# Patient Record
Sex: Male | Born: 1935 | Race: White | Hispanic: No | Smoking: Former smoker
Health system: Southern US, Community
[De-identification: ages and names within clinical notes are randomized; demographics above are authoritative.]

## PROBLEM LIST (undated history)

## (undated) DIAGNOSIS — M549 Dorsalgia, unspecified: Secondary | ICD-10-CM

## (undated) DIAGNOSIS — E119 Type 2 diabetes mellitus without complications: Secondary | ICD-10-CM

## (undated) HISTORY — PX: TONSILECTOMY, ADENOIDECTOMY, BILATERAL MYRINGOTOMY AND TUBES: SHX2538

## (undated) HISTORY — DX: Type 2 diabetes mellitus without complications: E11.9

## (undated) HISTORY — DX: Dorsalgia, unspecified: M54.9

---

## 1995-03-08 HISTORY — PX: PROSTATE SURGERY: SHX751

## 2005-03-07 HISTORY — PX: BLADDER SURGERY: SHX569

## 2016-06-27 DIAGNOSIS — C678 Malignant neoplasm of overlapping sites of bladder: Secondary | ICD-10-CM | POA: Diagnosis not present

## 2016-07-06 DIAGNOSIS — E1121 Type 2 diabetes mellitus with diabetic nephropathy: Secondary | ICD-10-CM | POA: Diagnosis not present

## 2016-07-25 DIAGNOSIS — E1121 Type 2 diabetes mellitus with diabetic nephropathy: Secondary | ICD-10-CM | POA: Diagnosis not present

## 2016-07-25 DIAGNOSIS — E782 Mixed hyperlipidemia: Secondary | ICD-10-CM | POA: Diagnosis not present

## 2016-07-25 DIAGNOSIS — M545 Low back pain: Secondary | ICD-10-CM | POA: Diagnosis not present

## 2016-07-25 DIAGNOSIS — I10 Essential (primary) hypertension: Secondary | ICD-10-CM | POA: Diagnosis not present

## 2016-11-21 DIAGNOSIS — Z23 Encounter for immunization: Secondary | ICD-10-CM | POA: Diagnosis not present

## 2017-01-09 DIAGNOSIS — E1121 Type 2 diabetes mellitus with diabetic nephropathy: Secondary | ICD-10-CM | POA: Diagnosis not present

## 2017-01-09 DIAGNOSIS — E782 Mixed hyperlipidemia: Secondary | ICD-10-CM | POA: Diagnosis not present

## 2017-02-01 DIAGNOSIS — E1121 Type 2 diabetes mellitus with diabetic nephropathy: Secondary | ICD-10-CM | POA: Diagnosis not present

## 2017-02-01 DIAGNOSIS — E782 Mixed hyperlipidemia: Secondary | ICD-10-CM | POA: Diagnosis not present

## 2017-02-01 DIAGNOSIS — Z0000 Encounter for general adult medical examination without abnormal findings: Secondary | ICD-10-CM | POA: Diagnosis not present

## 2017-02-01 DIAGNOSIS — Z6829 Body mass index (BMI) 29.0-29.9, adult: Secondary | ICD-10-CM | POA: Diagnosis not present

## 2017-02-01 DIAGNOSIS — I10 Essential (primary) hypertension: Secondary | ICD-10-CM | POA: Diagnosis not present

## 2017-02-01 DIAGNOSIS — M545 Low back pain: Secondary | ICD-10-CM | POA: Diagnosis not present

## 2017-08-14 ENCOUNTER — Ambulatory Visit (INDEPENDENT_AMBULATORY_CARE_PROVIDER_SITE_OTHER): Payer: Medicare Other | Admitting: Internal Medicine

## 2017-08-14 ENCOUNTER — Encounter: Payer: Self-pay | Admitting: Internal Medicine

## 2017-08-14 VITALS — BP 118/68 | HR 85 | Temp 98.6°F | Resp 16 | Ht 63.0 in | Wt 173.0 lb

## 2017-08-14 DIAGNOSIS — M545 Low back pain, unspecified: Secondary | ICD-10-CM

## 2017-08-14 DIAGNOSIS — Z8551 Personal history of malignant neoplasm of bladder: Secondary | ICD-10-CM | POA: Insufficient documentation

## 2017-08-14 DIAGNOSIS — E119 Type 2 diabetes mellitus without complications: Secondary | ICD-10-CM | POA: Diagnosis not present

## 2017-08-14 DIAGNOSIS — E118 Type 2 diabetes mellitus with unspecified complications: Secondary | ICD-10-CM | POA: Insufficient documentation

## 2017-08-14 DIAGNOSIS — F17201 Nicotine dependence, unspecified, in remission: Secondary | ICD-10-CM | POA: Diagnosis not present

## 2017-08-14 DIAGNOSIS — Z8546 Personal history of malignant neoplasm of prostate: Secondary | ICD-10-CM | POA: Diagnosis not present

## 2017-08-14 DIAGNOSIS — I10 Essential (primary) hypertension: Secondary | ICD-10-CM | POA: Diagnosis not present

## 2017-08-14 NOTE — Progress Notes (Signed)
Date:  08/14/2017   Name:  Jesus Chavez   DOB:  1931-04-03   MRN:  [id number]  New pt recently moved here from [place] to be closer to daughter.  Residing with wife in [place].  Chief Complaint: Establish Care Diabetes  He presents for his follow-up diabetic visit. He has type 2 diabetes mellitus. His disease course has been stable. Pertinent negatives for hypoglycemia include no dizziness or headaches. Pertinent negatives for diabetes include no chest pain. Pertinent negatives for diabetic complications include no PVD. Current diabetic treatment includes oral agent (monotherapy). He is compliant with treatment all of the time. His weight is stable. He rarely participates in exercise. There is no compliance with monitoring of blood glucose. An ACE inhibitor/angiotensin II receptor blocker is being taken.  Hypertension  This is a chronic problem. The problem is unchanged. Pertinent negatives include no chest pain, headaches, palpitations or shortness of breath. Risk factors for coronary artery disease include diabetes mellitus. Past treatments include ACE inhibitors. There is no history of kidney disease, CAD/MI or PVD.  Back Pain  This is a chronic problem. The pain is present in the lumbar spine. The symptoms are aggravated by standing and twisting. Pertinent negatives include no abdominal pain, chest pain, fever or headaches. He has tried analgesics (tramadol once a day - usually in the morning) for the symptoms. The treatment provided significant relief.  He had had no intervention for his back pain. 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    Review of Systems  Constitutional: Negative for diaphoresis, fever and unexpected weight change.  Respiratory: Negative for chest tightness, shortness of breath and wheezing.   Cardiovascular: Negative for chest pain and palpitations.  Gastrointestinal: Negative for abdominal pain, constipation and  diarrhea.  Genitourinary: Negative for difficulty urinating.       Nocturia x 2  Musculoskeletal: Positive for back pain.  Neurological: Negative for dizziness and headaches.  Psychiatric/Behavioral: Negative for sleep disturbance.    Patient Active Problem List   Diagnosis Date Noted  . Lumbar back pain 08/14/2017  . Essential hypertension 08/14/2017  . Type 2 diabetes mellitus without complication, without long-term current use of insulin ([place]) 08/14/2017  . History of bladder cancer 08/14/2017  . Tobacco use disorder, moderate, in sustained remission, dependence 08/14/2017    Prior to Admission medications   Medication Sig Start Date End Date Taking? Authorizing Provider  aspirin EC 81 MG tablet Take 81 mg by mouth daily.   Yes [provider]  glyBURIDE (DIABETA) 5 MG tablet Take 5 mg by mouth 3 (three) times daily. 07/12/17  Yes [provider]  Multiple Vitamins-Minerals ([facility]) TABS Take 1 tablet by mouth daily.   Yes [provider]  quinapril (ACCUPRIL) 40 MG tablet Take 40 mg by mouth 2 (two) times daily. 07/13/17  Yes [provider]  traMADol (ULTRAM) 50 MG tablet Take 50 mg by mouth every 6 (six) hours as needed. 07/12/17  Yes [provider]    No Known Allergies  Past Surgical History:  Procedure Laterality Date  . BLADDER SURGERY  2007   CANCER REMOVAL   . PROSTATE SURGERY  1997   CANCER REMOVAL   . TONSILECTOMY, ADENOIDECTOMY, BILATERAL MYRINGOTOMY AND TUBES      Social History   Tobacco Use  . Smoking status: Former Smoker    Packs/day: 1.00    Years: 60.00    Pack years:  60.00    Types: Cigarettes    Last attempt to quit: 2011    Years since quitting: 8.4  . Smokeless tobacco: Never Used  Substance Use Topics  . Alcohol use: Never    Frequency: Never  . Drug use: Never     Medication list has been reviewed and updated.  Current Meds  Medication Sig  . aspirin EC 81 MG tablet Take 81  mg by mouth daily.  Marland Kitchen glyBURIDE (DIABETA) 5 MG tablet Take 5 mg by mouth 3 (three) times daily.  . Multiple Vitamins-Minerals ([facility]) TABS Take 1 tablet by mouth daily.  . quinapril (ACCUPRIL) 40 MG tablet Take 40 mg by mouth 2 (two) times daily.  . traMADol (ULTRAM) 50 MG tablet Take 50 mg by mouth every 6 (six) hours as needed.    PHQ 2/9 Scores 08/14/2017  PHQ - 2 Score 0    Physical Exam  Constitutional: He is oriented to person, place, and time. He appears well-developed. No distress.  HENT:  Head: Normocephalic and atraumatic.  Neck: Normal range of motion. Neck supple. Carotid bruit is not present.  Cardiovascular: Normal rate, regular rhythm and normal heart sounds.  Pulmonary/Chest: Effort normal and breath sounds normal. No respiratory distress. He has no wheezes.  Musculoskeletal: Normal range of motion.  Lymphadenopathy:    He has no cervical adenopathy.  Neurological: He is alert and oriented to person, place, and time.  Skin: Skin is warm and dry. No rash noted.  Psychiatric: He has a normal mood and affect. His behavior is normal. Thought content normal.  Nursing note and vitals reviewed.   BP 118/68   Pulse 85   Temp 98.6 F (37 C) (Oral)   Resp 16   Ht 5\' 3"  (1.6 m)   Wt 173 lb (78.5 kg)   SpO2 98%   BMI 30.65 kg/m   Assessment and Plan: 1. Type 2 diabetes mellitus without complication, without long-term current use of insulin (HCC) Continue oral agents Pt does not check BS Due for eye exam - pt asked to establish with ophthalmology - Comprehensive metabolic panel - Hemoglobin A1c  2. Essential hypertension controlled - CBC with Differential/Platelet  3. Lumbar back pain Continue tramadol daily PRN  4. History of prostate cancer Pt states no follow up needed; no further PSA testing  5. Tobacco use disorder, moderate, in sustained remission, dependence    No orders of the defined types were placed in this  encounter.   Partially dictated using Editor, commissioning. Any errors are unintentional.  Halina Maidens, MD [facility]  08/14/2017  There are no diagnoses linked to this encounter.

## 2017-08-14 NOTE — Patient Instructions (Signed)
Need to schedule diabetic eye exam

## 2017-08-15 LAB — CBC WITH DIFFERENTIAL/PLATELET
BASOS ABS: 0 10*3/uL (ref 0.0–0.2)
Basos: 0 %
EOS (ABSOLUTE): 0.4 10*3/uL (ref 0.0–0.4)
Eos: 4 %
Hematocrit: 44 % (ref 37.5–51.0)
Hemoglobin: 14.8 g/dL (ref 13.0–17.7)
IMMATURE GRANS (ABS): 0.1 10*3/uL (ref 0.0–0.1)
IMMATURE GRANULOCYTES: 1 %
LYMPHS: 20 %
Lymphocytes Absolute: 1.9 10*3/uL (ref 0.7–3.1)
MCH: 31 pg (ref 26.6–33.0)
MCHC: 33.6 g/dL (ref 31.5–35.7)
MCV: 92 fL (ref 79–97)
Monocytes Absolute: 0.8 10*3/uL (ref 0.1–0.9)
Monocytes: 9 %
NEUTROS PCT: 66 %
Neutrophils Absolute: 6.4 10*3/uL (ref 1.4–7.0)
PLATELETS: 233 10*3/uL (ref 150–450)
RBC: 4.78 x10E6/uL (ref 4.14–5.80)
RDW: 13.4 % (ref 12.3–15.4)
WBC: 9.7 10*3/uL (ref 3.4–10.8)

## 2017-08-15 LAB — COMPREHENSIVE METABOLIC PANEL
ALT: 31 IU/L (ref 0–44)
AST: 31 IU/L (ref 0–40)
Albumin/Globulin Ratio: 1.5 (ref 1.2–2.2)
Albumin: 3.8 g/dL (ref 3.5–4.7)
Alkaline Phosphatase: 106 IU/L (ref 39–117)
BILIRUBIN TOTAL: 0.3 mg/dL (ref 0.0–1.2)
BUN/Creatinine Ratio: 20 (ref 10–24)
BUN: 23 mg/dL (ref 8–27)
CALCIUM: 9.1 mg/dL (ref 8.6–10.2)
CHLORIDE: 100 mmol/L (ref 96–106)
CO2: 22 mmol/L (ref 20–29)
Creatinine, Ser: 1.15 mg/dL (ref 0.76–1.27)
GFR calc non Af Amer: 58 mL/min/{1.73_m2} — ABNORMAL LOW (ref >59)
GFR, EST AFRICAN AMERICAN: 67 mL/min/{1.73_m2} (ref >59)
GLUCOSE: 76 mg/dL (ref 65–99)
Globulin, Total: 2.5 g/dL (ref 1.5–4.5)
Potassium: 5.6 mmol/L — ABNORMAL HIGH (ref 3.5–5.2)
Sodium: 137 mmol/L (ref 134–144)
Total Protein: 6.3 g/dL (ref 6.0–8.5)

## 2017-08-15 LAB — HEMOGLOBIN A1C
Est. average glucose Bld gHb Est-mCnc: 137 mg/dL
Hgb A1c MFr Bld: 6.4 % — ABNORMAL HIGH (ref 4.8–5.6)

## 2017-10-20 DIAGNOSIS — H5203 Hypermetropia, bilateral: Secondary | ICD-10-CM | POA: Diagnosis not present

## 2017-10-20 DIAGNOSIS — H524 Presbyopia: Secondary | ICD-10-CM | POA: Diagnosis not present

## 2017-10-20 DIAGNOSIS — H25013 Cortical age-related cataract, bilateral: Secondary | ICD-10-CM | POA: Diagnosis not present

## 2017-10-20 DIAGNOSIS — Z7984 Long term (current) use of oral hypoglycemic drugs: Secondary | ICD-10-CM | POA: Diagnosis not present

## 2017-10-20 DIAGNOSIS — H2513 Age-related nuclear cataract, bilateral: Secondary | ICD-10-CM | POA: Diagnosis not present

## 2017-10-20 DIAGNOSIS — H52223 Regular astigmatism, bilateral: Secondary | ICD-10-CM | POA: Diagnosis not present

## 2017-10-20 DIAGNOSIS — E119 Type 2 diabetes mellitus without complications: Secondary | ICD-10-CM | POA: Diagnosis not present

## 2017-10-20 LAB — HM DIABETES EYE EXAM

## 2017-10-26 ENCOUNTER — Encounter: Payer: Self-pay | Admitting: Internal Medicine

## 2017-11-10 ENCOUNTER — Encounter: Payer: Self-pay | Admitting: Internal Medicine

## 2017-11-10 ENCOUNTER — Ambulatory Visit (INDEPENDENT_AMBULATORY_CARE_PROVIDER_SITE_OTHER): Payer: Medicare Other | Admitting: Internal Medicine

## 2017-11-10 VITALS — BP 128/70 | HR 98 | Ht 63.0 in | Wt 184.0 lb

## 2017-11-10 DIAGNOSIS — M545 Low back pain, unspecified: Secondary | ICD-10-CM

## 2017-11-10 DIAGNOSIS — I10 Essential (primary) hypertension: Secondary | ICD-10-CM | POA: Diagnosis not present

## 2017-11-10 DIAGNOSIS — E119 Type 2 diabetes mellitus without complications: Secondary | ICD-10-CM | POA: Diagnosis not present

## 2017-11-10 DIAGNOSIS — Z23 Encounter for immunization: Secondary | ICD-10-CM

## 2017-11-10 MED ORDER — QUINAPRIL HCL 40 MG PO TABS: 40.0000 mg | ORAL_TABLET | Freq: Two times a day (BID) | ORAL | 3 refills | Status: DC

## 2017-11-10 MED ORDER — TRAMADOL HCL 50 MG PO TABS: 50.0000 mg | ORAL_TABLET | Freq: Four times a day (QID) | ORAL | 5 refills | Status: DC | PRN

## 2017-11-10 MED ORDER — GLYBURIDE 5 MG PO TABS: 5.0000 mg | ORAL_TABLET | Freq: Three times a day (TID) | ORAL | 3 refills | Status: DC

## 2017-11-10 NOTE — Progress Notes (Signed)
Date:  11/10/2017   Name:  Jesus Chavez   DOB:  1932/01/09   MRN:  [id number]   Chief Complaint: Hypertension and Diabetes Diabetes  He presents for his follow-up diabetic visit. He has type 2 diabetes mellitus. His disease course has been stable. Pertinent negatives for hypoglycemia include no dizziness, headaches or tremors. Pertinent negatives for diabetes include no chest pain, no fatigue, no polydipsia and no polyuria. Symptoms are stable. Current diabetic treatment includes oral agent (monotherapy). He is compliant with treatment most of the time. He is following a generally healthy diet. There is no compliance with monitoring of blood glucose. An ACE inhibitor/angiotensin II receptor blocker is being taken. Eye exam is current.  Hypertension  This is a chronic problem. The problem is controlled. Pertinent negatives include no chest pain, headaches, neck pain, palpitations or shortness of breath. Past treatments include ACE inhibitors. The current treatment provides significant improvement.  Back Pain  This is a chronic problem. The problem occurs daily. The pain is present in the lumbar spine. The pain is mild. Stiffness is present in the morning. Pertinent negatives include no abdominal pain, chest pain, dysuria, headaches or numbness. He has tried analgesics for the symptoms. The treatment provided significant relief.   Lab Results  Component Value Date   HGBA1C 6.4 (H) 08/14/2017   No results found for: CHOL, HDL, LDLCALC, LDLDIRECT, TRIG, CHOLHDL    Review of Systems  Constitutional: Negative for appetite change, fatigue and unexpected weight change.  HENT: Positive for hearing loss.   Eyes: Negative for visual disturbance.  Respiratory: Negative for cough, shortness of breath and wheezing.   Cardiovascular: Positive for leg swelling (mild ankle swelling). Negative for chest pain and palpitations.  Gastrointestinal: Negative for abdominal pain and blood in stool.  Endocrine:  Negative for polydipsia and polyuria.  Genitourinary: Negative for dysuria, hematuria and urgency.  Musculoskeletal: Positive for back pain. Negative for joint swelling and neck pain.  Skin: Negative for color change and rash.  Allergic/Immunologic: Negative for environmental allergies.  Neurological: Negative for dizziness, tremors, numbness and headaches.  Psychiatric/Behavioral: Negative for dysphoric mood and sleep disturbance.    Patient Active Problem List   Diagnosis Date Noted  . Lumbar back pain 08/14/2017  . Essential hypertension 08/14/2017  . Type 2 diabetes mellitus without complication, without long-term current use of insulin ([place]) 08/14/2017  . History of bladder cancer 08/14/2017  . Tobacco use disorder, moderate, in sustained remission, dependence 08/14/2017  . History of prostate cancer 08/14/2017    No Known Allergies  Past Surgical History:  Procedure Laterality Date  . BLADDER SURGERY  2007   CANCER REMOVAL   . PROSTATE SURGERY  1997   CANCER REMOVAL   . TONSILECTOMY, ADENOIDECTOMY, BILATERAL MYRINGOTOMY AND TUBES      Social History   Tobacco Use  . Smoking status: Former Smoker    Packs/day: 1.00    Years: 60.00    Pack years: 60.00    Types: Cigarettes    Last attempt to quit: 2011    Years since quitting: 8.6  . Smokeless tobacco: Never Used  Substance Use Topics  . Alcohol use: Never    Frequency: Never  . Drug use: Never     Medication list has been reviewed and updated.  Current Meds  Medication Sig  . aspirin EC 81 MG tablet Take 81 mg by mouth daily.  Marland Kitchen glyBURIDE (DIABETA) 5 MG tablet Take 5 mg by mouth 3 (three) times daily.  Marland Kitchen  Multiple Vitamins-Minerals ([facility]) TABS Take 1 tablet by mouth daily.  . quinapril (ACCUPRIL) 40 MG tablet Take 40 mg by mouth 2 (two) times daily.  . traMADol (ULTRAM) 50 MG tablet Take 50 mg by mouth every 6 (six) hours as needed.    PHQ 2/9 Scores 08/14/2017  PHQ - 2 Score 0     Physical Exam  Constitutional: He is oriented to person, place, and time. He appears well-developed. No distress.  HENT:  Head: Normocephalic and atraumatic.  Neck: Normal range of motion. Neck supple.  Cardiovascular: Normal rate, regular rhythm and normal heart sounds.  Pulmonary/Chest: Effort normal and breath sounds normal. No respiratory distress.  Musculoskeletal: Normal range of motion.  Lymphadenopathy:    He has no cervical adenopathy.  Neurological: He is alert and oriented to person, place, and time.  Skin: Skin is warm and dry. No rash noted.  Psychiatric: He has a normal mood and affect. His behavior is normal. Thought content normal.  Nursing note and vitals reviewed.   BP 128/70 (BP Location: Right Arm, Patient Position: Sitting, Cuff Size: Normal)   Pulse 98   Ht 5\' 3"  (1.6 m)   Wt 184 lb (83.5 kg)   SpO2 96%   BMI 32.59 kg/m   Assessment and Plan: 1. Essential hypertension controlled - quinapril (ACCUPRIL) 40 MG tablet; Take 1 tablet (40 mg total) by mouth 2 (two) times daily.  Dispense: 180 tablet; Refill: 3  2. Type 2 diabetes mellitus without complication, without long-term current use of insulin (HCC) Stable, continue diet, recommend exercise - Hemoglobin A1c - Lipid panel - glyBURIDE (DIABETA) 5 MG tablet; Take 1 tablet (5 mg total) by mouth 3 (three) times daily.  Dispense: 270 tablet; Refill: 3  3. Need for influenza vaccination - Flu vaccine HIGH DOSE PF  4. Lumbar back pain Continue tramadol daily PRN   No orders of the defined types were placed in this encounter.   Partially dictated using Editor, commissioning. Any errors are unintentional.  Halina Maidens, MD [facility]  11/10/2017

## 2017-11-11 LAB — LIPID PANEL
CHOLESTEROL TOTAL: 173 mg/dL (ref 100–199)
Chol/HDL Ratio: 4.8 ratio (ref 0.0–5.0)
HDL: 36 mg/dL — AB (ref >39)
LDL CALC: 73 mg/dL (ref 0–99)
TRIGLYCERIDES: 321 mg/dL — AB (ref 0–149)
VLDL CHOLESTEROL CAL: 64 mg/dL — AB (ref 5–40)

## 2017-11-11 LAB — HEMOGLOBIN A1C
Est. average glucose Bld gHb Est-mCnc: 131 mg/dL
Hgb A1c MFr Bld: 6.2 % — ABNORMAL HIGH (ref 4.8–5.6)

## 2017-11-13 ENCOUNTER — Other Ambulatory Visit: Payer: Self-pay | Admitting: Internal Medicine

## 2017-11-13 DIAGNOSIS — E785 Hyperlipidemia, unspecified: Principal | ICD-10-CM

## 2017-11-13 DIAGNOSIS — E1169 Type 2 diabetes mellitus with other specified complication: Secondary | ICD-10-CM | POA: Insufficient documentation

## 2017-11-13 MED ORDER — ATORVASTATIN CALCIUM 10 MG PO TABS: 10.0000 mg | ORAL_TABLET | Freq: Every day | ORAL | 1 refills | Status: DC

## 2017-11-13 NOTE — Progress Notes (Signed)
Wife informed of labs and wants to take same as wife. Lipitor. Wants sent to [facility] in [place].

## 2018-03-16 ENCOUNTER — Ambulatory Visit (INDEPENDENT_AMBULATORY_CARE_PROVIDER_SITE_OTHER): Payer: Medicare Other | Admitting: Internal Medicine

## 2018-03-16 ENCOUNTER — Encounter: Payer: Self-pay | Admitting: Internal Medicine

## 2018-03-16 VITALS — BP 136/78 | HR 94 | Ht 63.0 in | Wt 187.4 lb

## 2018-03-16 DIAGNOSIS — I10 Essential (primary) hypertension: Secondary | ICD-10-CM | POA: Diagnosis not present

## 2018-03-16 DIAGNOSIS — E785 Hyperlipidemia, unspecified: Secondary | ICD-10-CM | POA: Diagnosis not present

## 2018-03-16 DIAGNOSIS — E119 Type 2 diabetes mellitus without complications: Secondary | ICD-10-CM | POA: Diagnosis not present

## 2018-03-16 DIAGNOSIS — Z23 Encounter for immunization: Secondary | ICD-10-CM | POA: Diagnosis not present

## 2018-03-16 DIAGNOSIS — E1169 Type 2 diabetes mellitus with other specified complication: Secondary | ICD-10-CM

## 2018-03-16 NOTE — Progress Notes (Signed)
Date:  03/16/2018   Name:  Jesus Chavez   DOB:  1931/04/17   MRN:  [id number]   Chief Complaint: Diabetes (4 month follow up. Needs Pnuem23.); Hypertension; and Hyperlipidemia  Diabetes  He presents for his follow-up diabetic visit. He has type 2 diabetes mellitus. His disease course has been stable. Pertinent negatives for hypoglycemia include no headaches or tremors. Pertinent negatives for diabetes include no chest pain, no fatigue, no polydipsia and no polyuria. There are no hypoglycemic complications. Current diabetic treatment includes oral agent (monotherapy). He is compliant with treatment all of the time. An ACE inhibitor/angiotensin II receptor blocker is being taken. Eye exam is current.  Hypertension  This is a chronic problem. The problem is controlled. Pertinent negatives include no chest pain, headaches, palpitations or shortness of breath. Past treatments include ACE inhibitors. The current treatment provides significant improvement.  Hyperlipidemia  This is a chronic problem. The problem is controlled. Pertinent negatives include no chest pain or shortness of breath. Current antihyperlipidemic treatment includes statins. The current treatment provides significant improvement of lipids.   Lab Results  Component Value Date   HGBA1C 6.2 (H) 11/10/2017   Lab Results  Component Value Date   CREATININE 1.15 08/14/2017   BUN 23 08/14/2017   NA 137 08/14/2017   K 5.6 (H) 08/14/2017   CL 100 08/14/2017   CO2 22 08/14/2017   Lab Results  Component Value Date   CHOL 173 11/10/2017   HDL 36 (L) 11/10/2017   LDLCALC 73 11/10/2017   TRIG 321 (H) 11/10/2017   CHOLHDL 4.8 11/10/2017     Review of Systems  Constitutional: Negative for appetite change, fatigue and unexpected weight change.  Eyes: Negative for visual disturbance.  Respiratory: Negative for cough, shortness of breath and wheezing.   Cardiovascular: Negative for chest pain, palpitations and leg swelling.    Gastrointestinal: Negative for abdominal pain and blood in stool.  Endocrine: Negative for polydipsia and polyuria.  Genitourinary: Negative for dysuria and hematuria.  Musculoskeletal: Positive for arthralgias and back pain.  Skin: Negative for color change and rash.  Neurological: Negative for tremors, numbness and headaches.  Psychiatric/Behavioral: Negative for dysphoric mood.    Patient Active Problem List   Diagnosis Date Noted  . Hyperlipidemia associated with type 2 diabetes mellitus ([place]) 11/13/2017  . Lumbar back pain 08/14/2017  . Essential hypertension 08/14/2017  . Type 2 diabetes mellitus without complication, without long-term current use of insulin ([place]) 08/14/2017  . History of bladder cancer 08/14/2017  . Tobacco use disorder, moderate, in sustained remission, dependence 08/14/2017  . History of prostate cancer 08/14/2017    No Known Allergies  Past Surgical History:  Procedure Laterality Date  . BLADDER SURGERY  2007   CANCER REMOVAL   . PROSTATE SURGERY  1997   CANCER REMOVAL   . TONSILECTOMY, ADENOIDECTOMY, BILATERAL MYRINGOTOMY AND TUBES      Social History   Tobacco Use  . Smoking status: Former Smoker    Packs/day: 1.00    Years: 60.00    Pack years: 60.00    Types: Cigarettes    Last attempt to quit: 2011    Years since quitting: 9.0  . Smokeless tobacco: Never Used  Substance Use Topics  . Alcohol use: Never    Frequency: Never  . Drug use: Never     Medication list has been reviewed and updated.  Current Meds  Medication Sig  . aspirin EC 81 MG tablet Take 81 mg by mouth  daily.  . atorvastatin (LIPITOR) 10 MG tablet Take 1 tablet (10 mg total) by mouth daily.  Marland Kitchen glyBURIDE (DIABETA) 5 MG tablet Take 1 tablet (5 mg total) by mouth 3 (three) times daily.  . Multiple Vitamins-Minerals ([facility]) TABS Take 1 tablet by mouth daily.  . quinapril (ACCUPRIL) 40 MG tablet Take 1 tablet (40 mg total) by mouth 2 (two) times  daily.  . traMADol (ULTRAM) 50 MG tablet Take 1 tablet (50 mg total) by mouth every 6 (six) hours as needed.    PHQ 2/9 Scores 03/16/2018 08/14/2017  PHQ - 2 Score 0 0    Physical Exam Vitals signs and nursing note reviewed.  Constitutional:      General: He is not in acute distress.    Appearance: He is well-developed.  HENT:     Head: Normocephalic and atraumatic.  Neck:     Musculoskeletal: Normal range of motion.     Vascular: No carotid bruit.  Cardiovascular:     Rate and Rhythm: Normal rate and regular rhythm.     Pulses: Normal pulses.  Pulmonary:     Effort: Pulmonary effort is normal. No respiratory distress.     Breath sounds: Normal breath sounds.  Musculoskeletal: Normal range of motion.  Skin:    General: Skin is warm and dry.  Neurological:     Mental Status: He is alert and oriented to person, place, and time.  Psychiatric:        Behavior: Behavior normal.        Thought Content: Thought content normal.    Wt Readings from Last 3 Encounters:  03/16/18 187 lb 6.4 oz (85 kg)  11/10/17 184 lb (83.5 kg)  08/14/17 173 lb (78.5 kg)    BP 136/78 (BP Location: Right Arm, Patient Position: Sitting, Cuff Size: Normal)   Pulse 94   Ht 5\' 3"  (1.6 m)   Wt 187 lb 6.4 oz (85 kg)   SpO2 94%   BMI 33.20 kg/m   Assessment and Plan: 1. Essential hypertension Controlled Continue current therapy  2. Type 2 diabetes mellitus without complication, without long-term current use of insulin (HCC) Stable but not checking BS Work on weight loss - Basic metabolic panel - Hemoglobin A1c  3. Hyperlipidemia associated with type 2 diabetes mellitus ([place]) On statin therapy  4. Need for vaccination for pneumococcus Completed today   Partially dictated using Editor, commissioning. Any errors are unintentional.  Halina Maidens, MD [facility]  03/16/2018

## 2018-03-16 NOTE — Patient Instructions (Signed)

## 2018-03-17 LAB — HEMOGLOBIN A1C
Est. average glucose Bld gHb Est-mCnc: 148 mg/dL
HEMOGLOBIN A1C: 6.8 % — AB (ref 4.8–5.6)

## 2018-03-17 LAB — BASIC METABOLIC PANEL
BUN / CREAT RATIO: 23 (ref 10–24)
BUN: 33 mg/dL — AB (ref 8–27)
CHLORIDE: 105 mmol/L (ref 96–106)
CO2: 19 mmol/L — ABNORMAL LOW (ref 20–29)
Calcium: 9.4 mg/dL (ref 8.6–10.2)
Creatinine, Ser: 1.43 mg/dL — ABNORMAL HIGH (ref 0.76–1.27)
GFR calc non Af Amer: 45 mL/min/{1.73_m2} — ABNORMAL LOW (ref >59)
GFR, EST AFRICAN AMERICAN: 52 mL/min/{1.73_m2} — AB (ref >59)
GLUCOSE: 156 mg/dL — AB (ref 65–99)
Potassium: 5.7 mmol/L — ABNORMAL HIGH (ref 3.5–5.2)
SODIUM: 139 mmol/L (ref 134–144)

## 2018-03-20 NOTE — Progress Notes (Signed)
Patient informed of lab results. Told to drink more fluids and we will recheck kidney function at next visit.

## 2018-04-11 ENCOUNTER — Other Ambulatory Visit: Payer: Self-pay | Admitting: Internal Medicine

## 2018-05-10 ENCOUNTER — Other Ambulatory Visit: Payer: Self-pay | Admitting: Internal Medicine

## 2018-05-10 DIAGNOSIS — E1169 Type 2 diabetes mellitus with other specified complication: Secondary | ICD-10-CM

## 2018-05-10 DIAGNOSIS — E785 Hyperlipidemia, unspecified: Principal | ICD-10-CM

## 2018-07-20 ENCOUNTER — Encounter: Payer: Self-pay | Admitting: Internal Medicine

## 2018-07-20 ENCOUNTER — Ambulatory Visit (INDEPENDENT_AMBULATORY_CARE_PROVIDER_SITE_OTHER): Payer: Medicare Other | Admitting: Internal Medicine

## 2018-07-20 ENCOUNTER — Other Ambulatory Visit: Payer: Self-pay

## 2018-07-20 VITALS — BP 128/78 | HR 111 | Ht 63.0 in | Wt 182.2 lb

## 2018-07-20 DIAGNOSIS — I10 Essential (primary) hypertension: Secondary | ICD-10-CM

## 2018-07-20 DIAGNOSIS — Z8546 Personal history of malignant neoplasm of prostate: Secondary | ICD-10-CM

## 2018-07-20 DIAGNOSIS — E1169 Type 2 diabetes mellitus with other specified complication: Secondary | ICD-10-CM

## 2018-07-20 DIAGNOSIS — Z8551 Personal history of malignant neoplasm of bladder: Secondary | ICD-10-CM | POA: Diagnosis not present

## 2018-07-20 DIAGNOSIS — Z0000 Encounter for general adult medical examination without abnormal findings: Secondary | ICD-10-CM | POA: Diagnosis not present

## 2018-07-20 DIAGNOSIS — E785 Hyperlipidemia, unspecified: Secondary | ICD-10-CM

## 2018-07-20 DIAGNOSIS — F17201 Nicotine dependence, unspecified, in remission: Secondary | ICD-10-CM

## 2018-07-20 DIAGNOSIS — E119 Type 2 diabetes mellitus without complications: Secondary | ICD-10-CM | POA: Diagnosis not present

## 2018-07-20 NOTE — Progress Notes (Signed)
Patient: Jesus Chavez, Male    DOB: 04-Aug-1931, 85 y.o.   MRN: [id number] Visit Date: 07/20/2018  Today's Provider: Halina Maidens, MD   Chief Complaint  Patient presents with  . Medicare Wellness   Subjective:    Annual wellness visit Jesus Chavez is a 85 y.o. male who presents today for his Subsequent Annual Wellness Visit. He feels fairly well. He reports exercising very little. He reports he is sleeping well.  He has been staying inside at his ALF, not able to walk much due to Covid-19 restrictions. He has aged out of colonoscopy, PSA Immunizations are up to date ----------------------------------------------------------- Hypertension  This is a chronic problem. The problem is controlled. Pertinent negatives include no chest pain, headaches, palpitations or shortness of breath. Past treatments include ACE inhibitors.  Diabetes  He presents for his follow-up diabetic visit. He has type 2 diabetes mellitus. His disease course has been stable. Pertinent negatives for hypoglycemia include no dizziness or headaches. Pertinent negatives for diabetes include no chest pain and no fatigue. Current diabetic treatment includes oral agent (monotherapy). He is compliant with treatment all of the time. There is no compliance with monitoring of blood glucose. An ACE inhibitor/angiotensin II receptor blocker is being taken.  Hyperlipidemia  The problem is controlled. Recent lipid tests were reviewed and are normal. Pertinent negatives include no chest pain, myalgias or shortness of breath. Current antihyperlipidemic treatment includes statins. The current treatment provides significant improvement of lipids.   Lab Results  Component Value Date   HGBA1C 6.8 (H) 03/16/2018   Lab Results  Component Value Date   CHOL 173 11/10/2017   HDL 36 (L) 11/10/2017   LDLCALC 73 11/10/2017   TRIG 321 (H) 11/10/2017   CHOLHDL 4.8 11/10/2017   Lab Results  Component Value Date   CREATININE 1.43 (H)  03/16/2018   BUN 33 (H) 03/16/2018   NA 139 03/16/2018   K 5.7 (H) 03/16/2018   CL 105 03/16/2018   CO2 19 (L) 03/16/2018     Review of Systems  Constitutional: Negative for appetite change, chills, diaphoresis, fatigue and unexpected weight change.  HENT: Positive for hearing loss (chronic). Negative for tinnitus, trouble swallowing and voice change.   Eyes: Negative for visual disturbance.  Respiratory: Negative for choking, shortness of breath and wheezing.   Cardiovascular: Negative for chest pain, palpitations and leg swelling.  Gastrointestinal: Negative for abdominal pain, blood in stool, constipation and diarrhea.  Genitourinary: Negative for difficulty urinating, dysuria and frequency.  Musculoskeletal: Negative for arthralgias, back pain and myalgias.  Skin: Negative for color change and rash.  Neurological: Negative for dizziness, syncope and headaches.  Hematological: Negative for adenopathy.  Psychiatric/Behavioral: Negative for dysphoric mood and sleep disturbance.    Social History   Socioeconomic History  . Marital status: Married    Spouse name: Not on file  . Number of children: Not on file  . Years of education: Not on file  . Highest education level: Not on file  Occupational History  . Occupation: RETIRED  Social Needs  . Financial resource strain: Not on file  . Food insecurity:    Worry: Not on file    Inability: Not on file  . Transportation needs:    Medical: Not on file    Non-medical: Not on file  Tobacco Use  . Smoking status: Former Smoker    Packs/day: 1.00    Years: 60.00    Pack years: 60.00    Types: Cigarettes    Last  attempt to quit: 2011    Years since quitting: 9.3  . Smokeless tobacco: Never Used  Substance and Sexual Activity  . Alcohol use: Never    Frequency: Never  . Drug use: Never  . Sexual activity: Not on file  Lifestyle  . Physical activity:    Days per week: Not on file    Minutes per session: Not on file  .  Stress: Not on file  Relationships  . Social connections:    Talks on phone: Not on file    Gets together: Not on file    Attends religious service: Not on file    Active member of club or organization: Not on file    Attends meetings of clubs or organizations: Not on file    Relationship status: Not on file  . Intimate partner violence:    Fear of current or ex partner: Not on file    Emotionally abused: Not on file    Physically abused: Not on file    Forced sexual activity: Not on file  Other Topics Concern  . Not on file  Social History Narrative  . Not on file    Patient Active Problem List   Diagnosis Date Noted  . Hyperlipidemia associated with type 2 diabetes mellitus ([place]) 11/13/2017  . Lumbar back pain 08/14/2017  . Essential hypertension 08/14/2017  . Type 2 diabetes mellitus without complication, without long-term current use of insulin ([place]) 08/14/2017  . History of bladder cancer 08/14/2017  . Tobacco use disorder, moderate, in sustained remission, dependence 08/14/2017  . History of prostate cancer 08/14/2017    Past Surgical History:  Procedure Laterality Date  . BLADDER SURGERY  2007   CANCER REMOVAL   . PROSTATE SURGERY  1997   CANCER REMOVAL   . TONSILECTOMY, ADENOIDECTOMY, BILATERAL MYRINGOTOMY AND TUBES      His family history includes Heart Problems in his mother; Hypertension in his father.     Current Meds  Medication Sig  . aspirin EC 81 MG tablet Take 81 mg by mouth daily.  Marland Kitchen atorvastatin (LIPITOR) 10 MG tablet TAKE 1 TABLET BY MOUTH ONCE DAILY  . glyBURIDE (DIABETA) 5 MG tablet Take 1 tablet (5 mg total) by mouth 3 (three) times daily.  . Multiple Vitamins-Minerals ([employer] E[facility] HEALTH & LUTEIN) TABS Take 1 tablet by mouth daily.  . quinapril (ACCUPRIL) 40 MG tablet Take 1 tablet (40 mg total) by mouth 2 (two) times daily.  . traMADol (ULTRAM) 50 MG tablet TAKE 1 TABLET BY MOUTH EVERY 6 HOURS AS NEEDED    Patient Care Team: Glean Hess,  MD as PCP - General (Internal Medicine)       Objective:   Wt Readings from Last 3 Encounters:  07/20/18 182 lb 3.2 oz (82.6 kg)  03/16/18 187 lb 6.4 oz (85 kg)  11/10/17 184 lb (83.5 kg)    Vitals: BP 128/78   Pulse (!) 111   Ht 5\' 3"  (1.6 m)   Wt 182 lb 3.2 oz (82.6 kg)   SpO2 95%   BMI 32.28 kg/m   Physical Exam Vitals signs and nursing note reviewed.  Constitutional:      Appearance: Normal appearance. He is well-developed.  HENT:     Head: Normocephalic.     Right Ear: Tympanic membrane, ear canal and external ear normal. Decreased hearing noted.     Left Ear: Tympanic membrane, ear canal and external ear normal. Decreased hearing noted.     Nose: Nose  normal.     Mouth/Throat:     Pharynx: Uvula midline.  Eyes:     Conjunctiva/sclera: Conjunctivae normal.     Pupils: Pupils are equal, round, and reactive to light.  Neck:     Musculoskeletal: Normal range of motion and neck supple.     Thyroid: No thyromegaly.     Vascular: No carotid bruit.  Cardiovascular:     Rate and Rhythm: Normal rate and regular rhythm.     Pulses:          Dorsalis pedis pulses are 1+ on the right side and 1+ on the left side.       Posterior tibial pulses are 1+ on the right side and 1+ on the left side.     Heart sounds: Normal heart sounds.  Pulmonary:     Effort: Pulmonary effort is normal.     Breath sounds: Normal breath sounds. No wheezing.  Chest:     Breasts:        Right: No mass.        Left: No mass.  Abdominal:     General: Abdomen is protuberant. Bowel sounds are normal.     Palpations: Abdomen is soft.     Tenderness: There is no abdominal tenderness. There is no right CVA tenderness, left CVA tenderness, guarding or rebound.     Hernia: No hernia (diastasis recti) is present.  Musculoskeletal: Normal range of motion.  Lymphadenopathy:     Cervical: No cervical adenopathy.  Skin:    General: Skin is warm and dry.     Comments: Numerous skin lesions - of various  pigmentation, size and shape, esp on the back  Neurological:     Mental Status: He is alert and oriented to person, place, and time.     Deep Tendon Reflexes: Reflexes are normal and symmetric.  Psychiatric:        Attention and Perception: Attention normal.        Mood and Affect: Mood normal.        Speech: Speech normal.        Behavior: Behavior normal.        Thought Content: Thought content normal.        Cognition and Memory: Cognition normal.        Judgment: Judgment normal.     Activities of Daily Living In your present state of health, do you have any difficulty performing the following activities: 07/20/2018 11/10/2017  Hearing? Tempie Donning  Vision? N N  Difficulty concentrating or making decisions? N N  Walking or climbing stairs? N N  Dressing or bathing? N N  Doing errands, shopping? N N  Preparing Food and eating ? Y -  Using the Toilet? N -  In the past six months, have you accidently leaked urine? N -  Do you have problems with loss of bowel control? N -  Managing your Medications? N -  Managing your Finances? Y -  Housekeeping or managing your Housekeeping? Y -  Some recent data might be hidden    Fall Risk Assessment Fall Risk  07/20/2018 03/16/2018 08/14/2017  Falls in the past year? 0 0 No  Number falls in past yr: 0 0 -  Injury with Fall? 0 0 -  Follow up Falls evaluation completed - -     Depression Screen PHQ 2/9 Scores 07/20/2018 03/16/2018 08/14/2017  PHQ - 2 Score 0 0 0    6CIT Screen 07/20/2018  What Year? 0 points  What month? 0 points  What time? 0 points  Count back from 20 0 points  Months in reverse 0 points  Repeat phrase 4 points  Total Score 4    Medicare Annual Wellness Visit Summary:  Reviewed patient's Family Medical History Reviewed and updated list of patient's medical providers Assessment of cognitive impairment was done Assessed patient's functional ability Established a written schedule for health screening [place] Completed and Reviewed  Exercise Activities and Dietary recommendations Goals   None     Immunization History  Administered Date(s) Administered  . Influenza, High Dose Seasonal PF 11/10/2017  . Pneumococcal Conjugate-13 04/17/2014  . Pneumococcal Polysaccharide-23 03/16/2018    Health Maintenance  Topic Date Due  . TETANUS/TDAP  11/11/2018 (Originally 05/17/1950)  . HEMOGLOBIN A1C  09/14/2018  . INFLUENZA VACCINE  10/06/2018  . OPHTHALMOLOGY EXAM  10/21/2018  . FOOT EXAM  11/11/2018  . PNA vac Low Risk Adult  Completed    Discussed health benefits of physical activity, and encouraged him to engage in regular exercise appropriate for his age and condition.    ------------------------------------------------------------------------------------------------------------  Assessment & Plan:  1. Medicare annual wellness visit, subsequent Measures satisfied  2. Essential hypertension controlled - CBC with Differential/Platelet  3. Type 2 diabetes mellitus without complication, without long-term current use of insulin (HCC) Continue oral agents - Comprehensive metabolic panel - Hemoglobin A1c - TSH  4. Hyperlipidemia associated with type 2 diabetes mellitus ([place]) On statin therapy - Lipid panel  5. Tobacco use disorder, moderate, in sustained remission, dependence Remains tobacco free  6. History of bladder cancer No residual sx and no further follow up Pt unable to void today  7. History of prostate cancer Doing well with no complaints No further screening recommended   Partially dictated using Dragon software. Any errors are unintentional.  Halina Maidens, MD [facility]  07/20/2018

## 2018-07-20 NOTE — Patient Instructions (Addendum)
Health Maintenance  Topic Date Due  . TETANUS/TDAP  11/11/2018 (Originally 05/17/1950)  . HEMOGLOBIN A1C  09/14/2018  . INFLUENZA VACCINE  10/06/2018  . OPHTHALMOLOGY EXAM  10/21/2018  . FOOT EXAM  11/11/2018  . PNA vac Low Risk Adult  Completed   I recommend a Dermatology evaluation for a skin survey.

## 2018-07-21 LAB — CBC WITH DIFFERENTIAL/PLATELET
Basophils Absolute: 0.1 10*3/uL (ref 0.0–0.2)
Basos: 1 %
EOS (ABSOLUTE): 0.4 10*3/uL (ref 0.0–0.4)
Eos: 3 %
Hematocrit: 45 % (ref 37.5–51.0)
Hemoglobin: 14.4 g/dL (ref 13.0–17.7)
Immature Grans (Abs): 0 10*3/uL (ref 0.0–0.1)
Immature Granulocytes: 0 %
Lymphocytes Absolute: 1.8 10*3/uL (ref 0.7–3.1)
Lymphs: 17 %
MCH: 30.6 pg (ref 26.6–33.0)
MCHC: 32 g/dL (ref 31.5–35.7)
MCV: 96 fL (ref 79–97)
Monocytes Absolute: 0.7 10*3/uL (ref 0.1–0.9)
Monocytes: 7 %
Neutrophils Absolute: 7.5 10*3/uL — ABNORMAL HIGH (ref 1.4–7.0)
Neutrophils: 72 %
Platelets: 215 10*3/uL (ref 150–450)
RBC: 4.7 x10E6/uL (ref 4.14–5.80)
RDW: 12 % (ref 11.6–15.4)
WBC: 10.5 10*3/uL (ref 3.4–10.8)

## 2018-07-21 LAB — LIPID PANEL
Chol/HDL Ratio: 3.5 ratio (ref 0.0–5.0)
Cholesterol, Total: 99 mg/dL — ABNORMAL LOW (ref 100–199)
HDL: 28 mg/dL — ABNORMAL LOW (ref >39)
LDL Calculated: 31 mg/dL (ref 0–99)
Triglycerides: 199 mg/dL — ABNORMAL HIGH (ref 0–149)
VLDL Cholesterol Cal: 40 mg/dL (ref 5–40)

## 2018-07-21 LAB — COMPREHENSIVE METABOLIC PANEL
ALT: 40 IU/L (ref 0–44)
AST: 35 IU/L (ref 0–40)
Albumin/Globulin Ratio: 1.6 (ref 1.2–2.2)
Albumin: 3.9 g/dL (ref 3.6–4.6)
Alkaline Phosphatase: 140 IU/L — ABNORMAL HIGH (ref 39–117)
BUN/Creatinine Ratio: 19 (ref 10–24)
BUN: 33 mg/dL — ABNORMAL HIGH (ref 8–27)
Bilirubin Total: 0.4 mg/dL (ref 0.0–1.2)
CO2: 20 mmol/L (ref 20–29)
Calcium: 9.1 mg/dL (ref 8.6–10.2)
Chloride: 106 mmol/L (ref 96–106)
Creatinine, Ser: 1.7 mg/dL — ABNORMAL HIGH (ref 0.76–1.27)
GFR calc Af Amer: 42 mL/min/{1.73_m2} — ABNORMAL LOW (ref >59)
GFR calc non Af Amer: 36 mL/min/{1.73_m2} — ABNORMAL LOW (ref >59)
Globulin, Total: 2.5 g/dL (ref 1.5–4.5)
Glucose: 265 mg/dL — ABNORMAL HIGH (ref 65–99)
Potassium: 6.2 mmol/L — ABNORMAL HIGH (ref 3.5–5.2)
Sodium: 140 mmol/L (ref 134–144)
Total Protein: 6.4 g/dL (ref 6.0–8.5)

## 2018-07-21 LAB — HEMOGLOBIN A1C
Est. average glucose Bld gHb Est-mCnc: 146 mg/dL
Hgb A1c MFr Bld: 6.7 % — ABNORMAL HIGH (ref 4.8–5.6)

## 2018-07-21 LAB — TSH: TSH: 3.12 u[IU]/mL (ref 0.450–4.500)

## 2018-07-22 ENCOUNTER — Encounter: Payer: Self-pay | Admitting: Internal Medicine

## 2018-07-22 DIAGNOSIS — N183 Chronic kidney disease, stage 3 (moderate): Secondary | ICD-10-CM

## 2018-08-08 ENCOUNTER — Other Ambulatory Visit: Payer: Self-pay | Admitting: Internal Medicine

## 2018-10-11 ENCOUNTER — Other Ambulatory Visit: Payer: Self-pay | Admitting: Internal Medicine

## 2018-10-12 NOTE — Telephone Encounter (Signed)
**   CALLED IN #[phone] WITH 0 RFS.

## 2018-11-08 ENCOUNTER — Other Ambulatory Visit: Payer: Self-pay | Admitting: Internal Medicine

## 2018-11-08 DIAGNOSIS — E1169 Type 2 diabetes mellitus with other specified complication: Secondary | ICD-10-CM

## 2018-11-14 ENCOUNTER — Other Ambulatory Visit: Payer: Self-pay | Admitting: Internal Medicine

## 2018-11-14 DIAGNOSIS — E119 Type 2 diabetes mellitus without complications: Secondary | ICD-10-CM

## 2018-11-14 DIAGNOSIS — I10 Essential (primary) hypertension: Secondary | ICD-10-CM

## 2018-11-30 ENCOUNTER — Ambulatory Visit: Payer: Medicare Other | Admitting: Internal Medicine

## 2018-12-14 ENCOUNTER — Encounter: Payer: Self-pay | Admitting: Internal Medicine

## 2018-12-14 ENCOUNTER — Other Ambulatory Visit: Payer: Self-pay

## 2018-12-14 ENCOUNTER — Ambulatory Visit (INDEPENDENT_AMBULATORY_CARE_PROVIDER_SITE_OTHER): Payer: Medicare Other | Admitting: Internal Medicine

## 2018-12-14 VITALS — BP 124/62 | HR 76 | Ht 63.0 in | Wt 186.0 lb

## 2018-12-14 DIAGNOSIS — Z23 Encounter for immunization: Secondary | ICD-10-CM

## 2018-12-14 DIAGNOSIS — I10 Essential (primary) hypertension: Secondary | ICD-10-CM

## 2018-12-14 DIAGNOSIS — E1169 Type 2 diabetes mellitus with other specified complication: Secondary | ICD-10-CM

## 2018-12-14 DIAGNOSIS — E785 Hyperlipidemia, unspecified: Secondary | ICD-10-CM

## 2018-12-14 DIAGNOSIS — N1832 Chronic kidney disease, stage 3b: Secondary | ICD-10-CM | POA: Diagnosis not present

## 2018-12-14 DIAGNOSIS — E118 Type 2 diabetes mellitus with unspecified complications: Secondary | ICD-10-CM

## 2018-12-14 NOTE — Patient Instructions (Signed)
Schedule Diabetic eye exam.

## 2018-12-14 NOTE — Progress Notes (Signed)
Date:  12/14/2018   Name:  Jesus Chavez   DOB:  23-Aug-1931   MRN:  [id number]   Chief Complaint: Hypertension and Diabetes (Foot exam,.)  Hypertension This is a chronic problem. The problem is controlled. Pertinent negatives include no chest pain, headaches, palpitations or shortness of breath. Past treatments include ACE inhibitors.  Diabetes He presents for his follow-up diabetic visit. He has type 2 diabetes mellitus. His disease course has been stable. Pertinent negatives for hypoglycemia include no headaches or tremors. Pertinent negatives for diabetes include no chest pain, no fatigue, no polydipsia and no polyuria. Current diabetic treatment includes oral agent (monotherapy) (diabeta). He is compliant with treatment all of the time.  Hyperlipidemia This is a chronic problem. The problem is controlled. Pertinent negatives include no chest pain or shortness of breath. Current antihyperlipidemic treatment includes statins. The current treatment provides significant improvement of lipids.  CKD - pt denies chest pain, SOB, PND, edema.  He does not consume a large amount of sodium.  He does not take nsaids on a regular basis.  He is s/p treatment for prostate cancer 30 years ago.  Lab Results  Component Value Date   HGBA1C 6.7 (H) 07/20/2018   Lab Results  Component Value Date   CREATININE 1.70 (H) 07/20/2018   BUN 33 (H) 07/20/2018   NA 140 07/20/2018   K 6.2 (H) 07/20/2018   CL 106 07/20/2018   CO2 20 07/20/2018   Lab Results  Component Value Date   CHOL 99 (L) 07/20/2018   HDL 28 (L) 07/20/2018   LDLCALC 31 07/20/2018   TRIG 199 (H) 07/20/2018   CHOLHDL 3.5 07/20/2018     Review of Systems  Constitutional: Negative for appetite change, fatigue and unexpected weight change.  Eyes: Negative for visual disturbance.  Respiratory: Negative for cough, shortness of breath and wheezing.   Cardiovascular: Negative for chest pain, palpitations and leg swelling.   Gastrointestinal: Negative for abdominal pain and blood in stool.  Endocrine: Negative for polydipsia and polyuria.  Genitourinary: Negative for dysuria and hematuria.  Skin: Negative for color change and rash.  Neurological: Negative for tremors, numbness and headaches.  Psychiatric/Behavioral: Negative for dysphoric mood.    Patient Active Problem List   Diagnosis Date Noted  . CKD (chronic kidney disease) stage 3, GFR 30-59 ml/min 07/22/2018  . Hyperlipidemia associated with type 2 diabetes mellitus ([place]) 11/13/2017  . Lumbar back pain 08/14/2017  . Essential hypertension 08/14/2017  . Type II diabetes mellitus with complication ([place]) [id number]  . History of bladder cancer 08/14/2017  . Tobacco use disorder, moderate, in sustained remission, dependence 08/14/2017  . History of prostate cancer 08/14/2017    No Known Allergies  Past Surgical History:  Procedure Laterality Date  . BLADDER SURGERY  2007   CANCER REMOVAL   . PROSTATE SURGERY  1997   CANCER REMOVAL   . TONSILECTOMY, ADENOIDECTOMY, BILATERAL MYRINGOTOMY AND TUBES      Social History   Tobacco Use  . Smoking status: Former Smoker    Packs/day: 1.00    Years: 60.00    Pack years: 60.00    Types: Cigarettes    Quit date: 2011    Years since quitting: 9.7  . Smokeless tobacco: Never Used  Substance Use Topics  . Alcohol use: Never    Frequency: Never  . Drug use: Never     Medication list has been reviewed and updated.  Current Meds  Medication Sig  . aspirin EC 81  MG tablet Take 81 mg by mouth daily.  Marland Kitchen atorvastatin (LIPITOR) 10 MG tablet TAKE 1 TABLET BY MOUTH ONCE DAILY  . glyBURIDE (DIABETA) 5 MG tablet TAKE 1 TABLET BY MOUTH 3 TIMES DAILY  . Multiple Vitamins-Minerals ([employer] E[facility] HE[facility] LUTEIN) TABS Take 1 tablet by mouth daily.  . quinapril (ACCUPRIL) 40 MG tablet TAKE 1 TABLET BY MOUTH TWICE DAILY  . traMADol (ULTRAM) 50 MG tablet TAKE 1 TABLET BY MOUTH EVERY 6 HOURS AS NEEDED    PHQ 2/9  Scores 12/14/2018 07/20/2018 03/16/2018 08/14/2017  PHQ - 2 Score 0 0 0 0    BP Readings from Last 3 Encounters:  12/14/18 124/62  07/20/18 128/78  03/16/18 136/78    Physical Exam Vitals signs and nursing note reviewed.  Constitutional:      General: He is not in acute distress.    Appearance: He is well-developed.  HENT:     Head: Normocephalic and atraumatic.  Neck:     Musculoskeletal: Normal range of motion.     Vascular: No carotid bruit.  Cardiovascular:     Rate and Rhythm: Normal rate and regular rhythm.     Pulses: Normal pulses.  Pulmonary:     Effort: Pulmonary effort is normal. No respiratory distress.  Musculoskeletal:     Right lower leg: No edema.     Left lower leg: No edema.  Lymphadenopathy:     Cervical: No cervical adenopathy.  Skin:    General: Skin is warm and dry.     Capillary Refill: Capillary refill takes less than 2 seconds.     Findings: Ecchymosis (senile ecchymoses on arms) present. No rash.  Neurological:     General: No focal deficit present.     Mental Status: He is alert and oriented to person, place, and time.  Psychiatric:        Behavior: Behavior normal.        Thought Content: Thought content normal.     Wt Readings from Last 3 Encounters:  12/14/18 186 lb (84.4 kg)  07/20/18 182 lb 3.2 oz (82.6 kg)  03/16/18 187 lb 6.4 oz (85 kg)    BP 124/62   Pulse 76   Ht 5\' 3"  (1.6 m)   Wt 186 lb (84.4 kg)   SpO2 95%   BMI 32.95 kg/m   Assessment and Plan: 1. Type II diabetes mellitus with complication (HCC) Clinically stable by exam and report without s/s of hypoglycemia. DM complicated by HTN, lipids, CKD. Tolerating medications diabeta well without side effects or other concerns.  2. Essential hypertension Clinically stable exam with well controlled BP.   Tolerating medications, quinapril 40 mg, without side effects at this time. Pt to continue current regimen and low sodium diet; benefits of regular exercise as able  discussed.  3. Hyperlipidemia associated with type 2 diabetes mellitus ([place]) Tolerating hign intensity statin medication without side effects at this time LDL is at goal of < 70 on current dose Continue same therapy without change at this time.  4. Stage 3b chronic kidney disease Continue to monitor closely for worsening, electrolyte changes, etc - Basic metabolic panel    Partially dictated using Editor, commissioning. Any errors are unintentional.  Halina Maidens, MD [facility]  12/14/2018

## 2018-12-15 LAB — BASIC METABOLIC PANEL
BUN/Creatinine Ratio: 24 (ref 10–24)
BUN: 36 mg/dL — ABNORMAL HIGH (ref 8–27)
CO2: 21 mmol/L (ref 20–29)
Calcium: 8.9 mg/dL (ref 8.6–10.2)
Chloride: 105 mmol/L (ref 96–106)
Creatinine, Ser: 1.48 mg/dL — ABNORMAL HIGH (ref 0.76–1.27)
GFR calc Af Amer: 49 mL/min/{1.73_m2} — ABNORMAL LOW (ref >59)
GFR calc non Af Amer: 43 mL/min/{1.73_m2} — ABNORMAL LOW (ref >59)
Glucose: 213 mg/dL — ABNORMAL HIGH (ref 65–99)
Potassium: 5.9 mmol/L — ABNORMAL HIGH (ref 3.5–5.2)
Sodium: 139 mmol/L (ref 134–144)

## 2018-12-15 LAB — HEMOGLOBIN A1C
Est. average glucose Bld gHb Est-mCnc: 148 mg/dL
Hgb A1c MFr Bld: 6.8 % — ABNORMAL HIGH (ref 4.8–5.6)

## 2019-02-07 ENCOUNTER — Other Ambulatory Visit: Payer: Self-pay | Admitting: Internal Medicine

## 2019-02-07 DIAGNOSIS — E785 Hyperlipidemia, unspecified: Secondary | ICD-10-CM

## 2019-04-10 ENCOUNTER — Other Ambulatory Visit: Payer: Self-pay | Admitting: Internal Medicine

## 2019-04-11 ENCOUNTER — Ambulatory Visit: Payer: Medicare Other | Admitting: Internal Medicine

## 2019-04-26 ENCOUNTER — Ambulatory Visit: Payer: Medicare Other | Admitting: Internal Medicine

## 2019-05-06 ENCOUNTER — Other Ambulatory Visit: Payer: Self-pay | Admitting: Internal Medicine

## 2019-07-10 ENCOUNTER — Encounter: Payer: Medicare Other | Admitting: Internal Medicine

## 2019-07-26 ENCOUNTER — Ambulatory Visit (INDEPENDENT_AMBULATORY_CARE_PROVIDER_SITE_OTHER): Payer: Medicare Other | Admitting: Internal Medicine

## 2019-07-26 ENCOUNTER — Other Ambulatory Visit: Payer: Self-pay

## 2019-07-26 ENCOUNTER — Encounter: Payer: Self-pay | Admitting: Internal Medicine

## 2019-07-26 VITALS — BP 136/68 | HR 92 | Temp 98.0°F | Ht 63.0 in | Wt 187.0 lb

## 2019-07-26 DIAGNOSIS — E1169 Type 2 diabetes mellitus with other specified complication: Secondary | ICD-10-CM

## 2019-07-26 DIAGNOSIS — I10 Essential (primary) hypertension: Secondary | ICD-10-CM | POA: Diagnosis not present

## 2019-07-26 DIAGNOSIS — N1832 Chronic kidney disease, stage 3b: Secondary | ICD-10-CM | POA: Diagnosis not present

## 2019-07-26 DIAGNOSIS — E785 Hyperlipidemia, unspecified: Secondary | ICD-10-CM

## 2019-07-26 DIAGNOSIS — F17201 Nicotine dependence, unspecified, in remission: Secondary | ICD-10-CM

## 2019-07-26 DIAGNOSIS — E118 Type 2 diabetes mellitus with unspecified complications: Secondary | ICD-10-CM

## 2019-07-26 NOTE — Patient Instructions (Signed)
Please schedule diabetic eye exam.  Consider podiatry referral for foot and nail care.

## 2019-07-26 NOTE — Progress Notes (Signed)
P   Date:  07/26/2019   Name:  Jesus Chavez   DOB:  1931/10/02   MRN:  [id number]   Chief Complaint: Annual Exam Jesus Chavez is a 86 y.o. male who presents today for his Complete Annual Exam. He feels fairly well. He reports exercising very little due to poor balance. He reports he is sleeping fairly well. He is slightly depressed since his wife passed away but coping well.  Living with his daughter, Jesus Chavez.  Immunization History  Administered Date(s) Administered  . Fluad Quad(high Dose 65+) 12/14/2018  . Influenza, High Dose Seasonal PF 11/10/2017  . PFIZER SARS-COV-2 Vaccination 04/09/2019, 04/30/2019  . Pneumococcal Conjugate-13 04/17/2014  . Pneumococcal Polysaccharide-23 03/16/2018    Diabetes He presents for his follow-up diabetic visit. He has type 2 diabetes mellitus. His disease course has been stable. There are no hypoglycemic associated symptoms. Pertinent negatives for hypoglycemia include no dizziness, headaches or nervousness/anxiousness. Pertinent negatives for diabetes include no chest pain, no fatigue, no foot paresthesias, no visual change and no weight loss. There are no hypoglycemic complications. Symptoms are stable. Current diabetic treatment includes oral agent (monotherapy) (diabeta). He is compliant with treatment all of the time. There is no compliance with monitoring of blood glucose. An ACE inhibitor/angiotensin II receptor blocker is being taken. Eye exam is not current.  Hypertension This is a chronic problem. The problem is controlled. Pertinent negatives include no chest pain, headaches, palpitations or shortness of breath. Past treatments include ACE inhibitors. The current treatment provides significant improvement.  Hyperlipidemia The problem is controlled. Pertinent negatives include no chest pain or shortness of breath. Current antihyperlipidemic treatment includes statins. The current treatment provides significant improvement of lipids.     Lab Results  Component Value Date   CREATININE 1.48 (H) 12/14/2018   BUN 36 (H) 12/14/2018   NA 139 12/14/2018   K 5.9 (H) 12/14/2018   CL 105 12/14/2018   CO2 21 12/14/2018   Lab Results  Component Value Date   CHOL 99 (L) 07/20/2018   HDL 28 (L) 07/20/2018   LDLCALC 31 07/20/2018   TRIG 199 (H) 07/20/2018   CHOLHDL 3.5 07/20/2018   Lab Results  Component Value Date   TSH 3.120 07/20/2018   Lab Results  Component Value Date   HGBA1C 6.8 (H) 12/14/2018   Lab Results  Component Value Date   WBC 10.5 07/20/2018   HGB 14.4 07/20/2018   HCT 45.0 07/20/2018   MCV 96 07/20/2018   PLT 215 07/20/2018   Lab Results  Component Value Date   ALT 40 07/20/2018   AST 35 07/20/2018   ALKPHOS 140 (H) 07/20/2018   BILITOT 0.4 07/20/2018     Review of Systems  Constitutional: Negative for chills, fatigue, fever and weight loss.  HENT: Negative for trouble swallowing.   Respiratory: Negative for cough, chest tightness and shortness of breath.   Cardiovascular: Positive for leg swelling. Negative for chest pain and palpitations.  Gastrointestinal: Negative for abdominal pain, constipation and diarrhea.  Genitourinary: Negative for difficulty urinating.  Musculoskeletal: Positive for arthralgias and gait problem (uses rollator).  Skin: Negative for rash.  Neurological: Negative for dizziness, light-headedness and headaches.  Hematological: Bruises/bleeds easily.  Psychiatric/Behavioral: Positive for dysphoric mood. Negative for sleep disturbance. The patient is not nervous/anxious.     Patient Active Problem List   Diagnosis Date Noted  . Stage 3b chronic kidney disease 12/14/2018  . Hyperlipidemia associated with type 2 diabetes mellitus ([place]) 11/13/2017  . Lumbar  back pain 08/14/2017  . Essential hypertension 08/14/2017  . Type II diabetes mellitus with complication ([place]) [id number]  . History of bladder cancer 08/14/2017  . Tobacco use disorder, moderate, in  sustained remission, dependence 08/14/2017  . History of prostate cancer 08/14/2017    No Known Allergies  Past Surgical History:  Procedure Laterality Date  . BLADDER SURGERY  2007   CANCER REMOVAL   . PROSTATE SURGERY  1997   CANCER REMOVAL   . TONSILECTOMY, ADENOIDECTOMY, BILATERAL MYRINGOTOMY AND TUBES      Social History   Tobacco Use  . Smoking status: Former Smoker    Packs/day: 1.00    Years: 60.00    Pack years: 60.00    Types: Cigarettes    Quit date: 2011    Years since quitting: 10.3  . Smokeless tobacco: Never Used  Substance Use Topics  . Alcohol use: Never  . Drug use: Never     Medication list has been reviewed and updated.  Current Meds  Medication Sig  . aspirin EC 81 MG tablet Take 81 mg by mouth daily.  Marland Kitchen atorvastatin (LIPITOR) 10 MG tablet TAKE 1 TABLET BY MOUTH ONCE DAILY  . glyBURIDE (DIABETA) 5 MG tablet TAKE 1 TABLET BY MOUTH 3 TIMES DAILY (Patient taking differently: Take 5 mg by mouth in the morning, at noon, and at bedtime. )  . Multiple Vitamins-Minerals ([employer] EYE HEALTH & LUTEIN) TABS Take 1 tablet by mouth daily.  . quinapril (ACCUPRIL) 40 MG tablet TAKE 1 TABLET BY MOUTH TWICE DAILY (Patient taking differently: Take 40 mg by mouth in the morning and at bedtime. )  . traMADol (ULTRAM) 50 MG tablet TAKE 1 TABLET BY MOUTH EVERY 6 HOURS AS NEEDED    PHQ 2/9 Scores 07/26/2019 12/14/2018 07/20/2018 03/16/2018  PHQ - 2 Score 4 0 0 0  PHQ- 9 Score 4 - - -    BP Readings from Last 3 Encounters:  07/26/19 136/68  12/14/18 124/62  07/20/18 128/78    Physical Exam Vitals and nursing note reviewed.  Constitutional:      General: He is not in acute distress.    Appearance: He is well-developed.  HENT:     Head: Normocephalic and atraumatic.     Right Ear: Decreased hearing noted. There is impacted cerumen.     Left Ear: Decreased hearing noted. There is impacted cerumen.  Neck:     Vascular: No carotid bruit.  Cardiovascular:     Rate  and Rhythm: Normal rate and regular rhythm.     Heart sounds: No murmur.  Pulmonary:     Effort: Pulmonary effort is normal. No respiratory distress.     Breath sounds: No wheezing or rhonchi.  Abdominal:     General: Bowel sounds are normal.     Palpations: Abdomen is soft.     Tenderness: There is no abdominal tenderness.  Musculoskeletal:     Cervical back: Normal range of motion.     Right lower leg: Edema present.     Left lower leg: Edema present.  Lymphadenopathy:     Cervical: No cervical adenopathy.  Skin:    General: Skin is warm and dry.     Capillary Refill: Capillary refill takes less than 2 seconds.     Findings: Ecchymosis present. No rash.  Neurological:     General: No focal deficit present.     Mental Status: He is alert and oriented to person, place, and time.  Psychiatric:  Mood and Affect: Mood normal.     Wt Readings from Last 3 Encounters:  07/26/19 187 lb (84.8 kg)  12/14/18 186 lb (84.4 kg)  07/20/18 182 lb 3.2 oz (82.6 kg)    BP 136/68   Pulse 92   Temp 98 F (36.7 C) (Oral)   Ht 5\' 3"  (1.6 m)   Wt 187 lb (84.8 kg)   SpO2 96%   BMI 33.13 kg/m   Assessment and Plan: 1. Type II diabetes mellitus with complication (HCC) Clinically stable by exam and report without s/s of hypoglycemia. DM complicated by renal disease, htn. Tolerating medications -SNU -  well without side effects or other concerns. - Comprehensive metabolic panel - Hemoglobin A1c  2. Essential hypertension Clinically stable exam with well controlled BP on ace-hct. Tolerating medications without side effects at this time. Pt to continue current regimen and low sodium diet; benefits of regular exercise as able discussed. - CBC with Differential/Platelet  3. Hyperlipidemia associated with type 2 diabetes mellitus ([place]) Tolerating statin medication without side effects at this time LDL is at goal of < 70 on current dose Continue same therapy without change at this  time. - Lipid panel  4. Stage 3b chronic kidney disease Stable, continue to monitor - Comprehensive metabolic panel  5. Tobacco use disorder, moderate, in sustained remission, dependence   Partially dictated using Editor, commissioning. Any errors are unintentional.  Halina Maidens, MD [facility]  07/26/2019

## 2019-07-27 LAB — COMPREHENSIVE METABOLIC PANEL
ALT: 27 IU/L (ref 0–44)
AST: 33 IU/L (ref 0–40)
Albumin/Globulin Ratio: 1.4 (ref 1.2–2.2)
Albumin: 3.7 g/dL (ref 3.6–4.6)
Alkaline Phosphatase: 151 IU/L — ABNORMAL HIGH (ref 48–121)
BUN/Creatinine Ratio: 17 (ref 10–24)
BUN: 23 mg/dL (ref 8–27)
Bilirubin Total: 0.5 mg/dL (ref 0.0–1.2)
CO2: 23 mmol/L (ref 20–29)
Calcium: 9.1 mg/dL (ref 8.6–10.2)
Chloride: 106 mmol/L (ref 96–106)
Creatinine, Ser: 1.36 mg/dL — ABNORMAL HIGH (ref 0.76–1.27)
GFR calc Af Amer: 54 mL/min/{1.73_m2} — ABNORMAL LOW (ref >59)
GFR calc non Af Amer: 47 mL/min/{1.73_m2} — ABNORMAL LOW (ref >59)
Globulin, Total: 2.7 g/dL (ref 1.5–4.5)
Glucose: 146 mg/dL — ABNORMAL HIGH (ref 65–99)
Potassium: 4.9 mmol/L (ref 3.5–5.2)
Sodium: 144 mmol/L (ref 134–144)
Total Protein: 6.4 g/dL (ref 6.0–8.5)

## 2019-07-27 LAB — CBC WITH DIFFERENTIAL/PLATELET
Basophils Absolute: 0.1 10*3/uL (ref 0.0–0.2)
Basos: 1 %
EOS (ABSOLUTE): 0.3 10*3/uL (ref 0.0–0.4)
Eos: 4 %
Hematocrit: 50.9 % (ref 37.5–51.0)
Hemoglobin: 16.5 g/dL (ref 13.0–17.7)
Immature Grans (Abs): 0 10*3/uL (ref 0.0–0.1)
Immature Granulocytes: 0 %
Lymphocytes Absolute: 1.7 10*3/uL (ref 0.7–3.1)
Lymphs: 20 %
MCH: 30.4 pg (ref 26.6–33.0)
MCHC: 32.4 g/dL (ref 31.5–35.7)
MCV: 94 fL (ref 79–97)
Monocytes Absolute: 0.6 10*3/uL (ref 0.1–0.9)
Monocytes: 8 %
Neutrophils Absolute: 5.7 10*3/uL (ref 1.4–7.0)
Neutrophils: 67 %
Platelets: 208 10*3/uL (ref 150–450)
RBC: 5.43 x10E6/uL (ref 4.14–5.80)
RDW: 11.9 % (ref 11.6–15.4)
WBC: 8.4 10*3/uL (ref 3.4–10.8)

## 2019-07-27 LAB — HEMOGLOBIN A1C
Est. average glucose Bld gHb Est-mCnc: 140 mg/dL
Hgb A1c MFr Bld: 6.5 % — ABNORMAL HIGH (ref 4.8–5.6)

## 2019-07-27 LAB — LIPID PANEL
Chol/HDL Ratio: 3.2 ratio (ref 0.0–5.0)
Cholesterol, Total: 117 mg/dL (ref 100–199)
HDL: 37 mg/dL — ABNORMAL LOW (ref >39)
LDL Chol Calc (NIH): 57 mg/dL (ref 0–99)
Triglycerides: 126 mg/dL (ref 0–149)
VLDL Cholesterol Cal: 23 mg/dL (ref 5–40)

## 2019-07-29 ENCOUNTER — Other Ambulatory Visit: Payer: Self-pay | Admitting: Internal Medicine

## 2019-07-29 DIAGNOSIS — E785 Hyperlipidemia, unspecified: Secondary | ICD-10-CM

## 2019-07-29 NOTE — Telephone Encounter (Signed)
Requested medication (s) are due for refill today: yes  Requested medication (s) are on the active medication list:yes   Last refill:    05/06/19   #30  3 refills  Future visit scheduled  Yes 01/10/20  Notes to clinic: Not delegated  Requested Prescriptions  Pending Prescriptions Disp Refills   traMADol (ULTRAM) 50 MG tablet [Pharmacy Med Name: TRAMADOL HCL 50 MG TAB] 30 tablet     Sig: TAKE 1 TABLET BY MOUTH EVERY 6 HOURS AS NEEDED      Not Delegated - Analgesics:  Opioid Agonists Failed - 07/29/2019  4:16 PM      Failed - This refill cannot be delegated      Failed - Urine Drug Screen completed in last 360 days.      Passed - Valid encounter within last 6 months    Recent Outpatient Visits           3 days ago Type II diabetes mellitus with complication [facility])   [facility]an [facility], [facility]   7 months ago Type II diabetes mellitus with complication [facility])   [facility]   1 year ago Medicare annual wellness visit, subsequent   [facility] Glean Hess, MD   1 year ago Essential hypertension   [facility] Glean Hess, MD   1 year ago Essential hypertension   [place], Laura H, MD       Future Appointments             In 5 months Glean Hess, MD [facility] : [facility], [place]   In 1 year Glean Hess, MD [facility], [facility]             Signed Prescriptions Disp Refills   atorvastatin (LIPITOR) 10 MG tablet 90 tablet 1    Sig: TAKE 1 TABLET BY MOUTH ONCE DAILY      Cardiovascular:  Antilipid - Statins Failed - 07/29/2019  4:16 PM      Failed - LDL in normal range and within 360 days    LDL Chol Calc (NIH)  Date Value Ref Range Status  07/26/2019 57 0 - 99 mg/dL Final          Failed - HDL in normal range and within 360 days    HDL  Date Value Ref Range Status  07/26/2019 37 (L) >39 mg/dL Final          Passed - Total Cholesterol in normal range  and within 360 days    Cholesterol, Total  Date Value Ref Range Status  07/26/2019 117 100 - 199 mg/dL Final          Passed - Triglycerides in normal range and within 360 days    Triglycerides  Date Value Ref Range Status  07/26/2019 126 0 - 149 mg/dL Final          Passed - Patient is not pregnant      Passed - Valid encounter within last 12 months    Recent Outpatient Visits           3 days ago Type II diabetes mellitus with complication [facility])   [facility]an [facility], MD   7 months ago Type II diabetes mellitus with complication [facility])   [facility] Glean Hess, MD   1 year ago Medicare annual wellness visit, subsequent   [facility] Glean Hess, MD  1 year ago Essential hypertension   [facility] Glean Hess, MD   1 year ago Essential hypertension   [facility] Glean Hess, MD       Future Appointments             In 5 months Army Melia Jesse Sans, MD [facility], [place]   In 1 year Army Melia, Jesse Sans, MD [facility], [facility]

## 2019-08-14 ENCOUNTER — Other Ambulatory Visit: Payer: Self-pay | Admitting: Internal Medicine

## 2019-08-14 DIAGNOSIS — I10 Essential (primary) hypertension: Secondary | ICD-10-CM

## 2019-08-14 DIAGNOSIS — E119 Type 2 diabetes mellitus without complications: Secondary | ICD-10-CM

## 2020-01-10 ENCOUNTER — Ambulatory Visit: Payer: Medicare Other | Admitting: Internal Medicine

## 2020-01-17 ENCOUNTER — Ambulatory Visit (INDEPENDENT_AMBULATORY_CARE_PROVIDER_SITE_OTHER): Payer: Medicare Other | Admitting: Internal Medicine

## 2020-01-17 ENCOUNTER — Encounter: Payer: Self-pay | Admitting: Internal Medicine

## 2020-01-17 ENCOUNTER — Other Ambulatory Visit: Payer: Self-pay

## 2020-01-17 VITALS — BP 138/78 | HR 98 | Temp 97.3°F | Ht 63.0 in | Wt 181.0 lb

## 2020-01-17 DIAGNOSIS — E118 Type 2 diabetes mellitus with unspecified complications: Secondary | ICD-10-CM

## 2020-01-17 DIAGNOSIS — E1159 Type 2 diabetes mellitus with other circulatory complications: Secondary | ICD-10-CM

## 2020-01-17 DIAGNOSIS — E785 Hyperlipidemia, unspecified: Secondary | ICD-10-CM

## 2020-01-17 DIAGNOSIS — E1169 Type 2 diabetes mellitus with other specified complication: Secondary | ICD-10-CM

## 2020-01-17 DIAGNOSIS — M545 Low back pain, unspecified: Secondary | ICD-10-CM | POA: Diagnosis not present

## 2020-01-17 DIAGNOSIS — Z23 Encounter for immunization: Secondary | ICD-10-CM

## 2020-01-17 DIAGNOSIS — N1832 Chronic kidney disease, stage 3b: Secondary | ICD-10-CM

## 2020-01-17 NOTE — Progress Notes (Signed)
Date:  01/17/2020   Name:  Jesus Chavez   DOB:  11-22-1931   MRN:  [id number]   Chief Complaint: Diabetes (f/u), Hypertension, and Flu Vaccine  Diabetes He presents for his follow-up diabetic visit. He has type 2 diabetes mellitus. His disease course has been stable. Pertinent negatives for hypoglycemia include no headaches, nervousness/anxiousness or tremors. Pertinent negatives for diabetes include no chest pain, no fatigue, no polydipsia and no polyuria. Diabetic complications include nephropathy. Current diabetic treatment includes oral agent (monotherapy). He is compliant with treatment all of the time. An ACE inhibitor/angiotensin II receptor blocker is being taken.  Hypertension This is a chronic problem. The problem is controlled. Pertinent negatives include no chest pain, headaches, palpitations or shortness of breath. Past treatments include ACE inhibitors. Hypertensive end-organ damage includes kidney disease.   Immunization History  Administered Date(s) Administered  . Fluad Quad(high Dose 65+) 12/14/2018  . Influenza, High Dose Seasonal PF 11/10/2017  . PFIZER SARS-COV-2 Vaccination 04/09/2019, 04/30/2019  . Pneumococcal Conjugate-13 04/17/2014  . Pneumococcal Polysaccharide-23 03/16/2018    Lab Results  Component Value Date   CREATININE 1.36 (H) 07/26/2019   BUN 23 07/26/2019   NA 144 07/26/2019   K 4.9 07/26/2019   CL 106 07/26/2019   CO2 23 07/26/2019   Lab Results  Component Value Date   CHOL 117 07/26/2019   HDL 37 (L) 07/26/2019   LDLCALC 57 07/26/2019   TRIG 126 07/26/2019   CHOLHDL 3.2 07/26/2019   Lab Results  Component Value Date   TSH 3.120 07/20/2018   Lab Results  Component Value Date   HGBA1C 6.5 (H) 07/26/2019   Lab Results  Component Value Date   WBC 8.4 07/26/2019   HGB 16.5 07/26/2019   HCT 50.9 07/26/2019   MCV 94 07/26/2019   PLT 208 07/26/2019   Lab Results  Component Value Date   ALT 27 07/26/2019   AST 33 07/26/2019    ALKPHOS 151 (H) 07/26/2019   BILITOT 0.5 07/26/2019     Review of Systems  Constitutional: Negative for appetite change, fatigue and unexpected weight change.  HENT: Positive for hearing loss.   Eyes: Negative for visual disturbance.  Respiratory: Negative for cough, shortness of breath and wheezing.   Cardiovascular: Negative for chest pain, palpitations and leg swelling (occasional ankle swelling).  Gastrointestinal: Negative for abdominal pain and blood in stool.  Endocrine: Negative for polydipsia and polyuria.  Genitourinary: Negative for dysuria and hematuria.  Musculoskeletal: Positive for arthralgias, back pain and gait problem.  Skin: Negative for color change and rash.  Neurological: Negative for tremors, numbness and headaches.  Psychiatric/Behavioral: Negative for dysphoric mood and sleep disturbance. The patient is not nervous/anxious.     Patient Active Problem List   Diagnosis Date Noted  . Stage 3b chronic kidney disease ([place]) 12/14/2018  . Hyperlipidemia associated with type 2 diabetes mellitus ([place]) 11/13/2017  . Lumbar back pain 08/14/2017  . Essential hypertension 08/14/2017  . Type II diabetes mellitus with complication ([place]) 96/75/9163  . History of bladder cancer 08/14/2017  . Tobacco use disorder, moderate, in sustained remission, dependence 08/14/2017  . History of prostate cancer 08/14/2017    No Known Allergies  Past Surgical History:  Procedure Laterality Date  . BLADDER SURGERY  2007   CANCER REMOVAL   . PROSTATE SURGERY  1997   CANCER REMOVAL   . TONSILECTOMY, ADENOIDECTOMY, BILATERAL MYRINGOTOMY AND TUBES      Social History   Tobacco Use  . Smoking  status: Former Smoker    Packs/day: 1.00    Years: 60.00    Pack years: 60.00    Types: Cigarettes    Quit date: 2011    Years since quitting: 10.8  . Smokeless tobacco: Never Used  Vaping Use  . Vaping Use: Never used  Substance Use Topics  . Alcohol use: Never  . Drug use: Never      Medication list has been reviewed and updated.  Current Meds  Medication Sig  . aspirin EC 81 MG tablet Take 81 mg by mouth daily.  Marland Kitchen atorvastatin (LIPITOR) 10 MG tablet TAKE 1 TABLET BY MOUTH ONCE DAILY  . glyBURIDE (DIABETA) 5 MG tablet TAKE 1 TABLET BY MOUTH 3 TIMES DAILY  . Multiple Vitamins-Minerals ([employer] EYE HEALTH & LUTEIN) TABS Take 1 tablet by mouth daily.  . quinapril (ACCUPRIL) 40 MG tablet TAKE 1 TABLET BY MOUTH TWICE DAILY  . traMADol (ULTRAM) 50 MG tablet Take 50 mg by mouth every 6 (six) hours as needed.  Marland Kitchen UNABLE TO FIND Med Name: lutein    [facility] 2/9 Scores 01/17/2020 07/26/2019 12/14/2018 07/20/2018  PHQ - 2 Score 0 4 0 0  PHQ- 9 Score 0 4 - -    GAD 7 : Generalized Anxiety Score 01/17/2020 07/26/2019  Nervous, Anxious, on Edge 0 0  Control/stop worrying 0 0  Worry too much - different things 0 0  Trouble relaxing 0 0  Restless 0 0  Easily annoyed or irritable 0 0  Afraid - awful might happen 0 0  Total GAD 7 Score 0 0  Anxiety Difficulty - Not difficult at all    BP Readings from Last 3 Encounters:  01/17/20 138/78  07/26/19 136/68  12/14/18 124/62    Physical Exam Vitals and nursing note reviewed.  Constitutional:      General: He is not in acute distress.    Appearance: Normal appearance. He is well-developed.  HENT:     Head: Normocephalic and atraumatic.  Cardiovascular:     Rate and Rhythm: Normal rate and regular rhythm.     Pulses: Normal pulses.  Pulmonary:     Effort: Pulmonary effort is normal. No respiratory distress.     Breath sounds: No wheezing or rhonchi.  Musculoskeletal:     Right lower leg: No edema.     Left lower leg: No edema.  Lymphadenopathy:     Cervical: No cervical adenopathy.  Skin:    General: Skin is warm and dry.     Capillary Refill: Capillary refill takes less than 2 seconds.     Findings: No rash.  Neurological:     General: No focal deficit present.     Mental Status: He is alert and oriented to person,  place, and time.     Gait: Gait abnormal (uses rolator).  Psychiatric:        Mood and Affect: Mood normal.     Wt Readings from Last 3 Encounters:  01/17/20 181 lb (82.1 kg)  07/26/19 187 lb (84.8 kg)  12/14/18 186 lb (84.4 kg)    BP 138/78   Pulse 98   Temp (!) 97.3 F (36.3 C) (Oral)   Ht 5\' 3"  (1.6 m)   Wt 181 lb (82.1 kg)   SpO2 96%   BMI 32.06 kg/m   Assessment and Plan: 1. Type II diabetes mellitus with complication (HCC) Clinically stable by exam and report without s/s of hypoglycemia. He does not check his BS. DM complicated by HTN.  Tolerating medications well without side effects or other concerns. - Hemoglobin A1c  2. Type 2 diabetes mellitus with vascular disease ([place]) HTN well controlled on appropriate ACEI  3. Stage 3b chronic kidney disease ([place]) Continue to monitor Avoid nsaids - Basic metabolic panel  4. Hyperlipidemia associated with type 2 diabetes mellitus ([place]) On high intensity statin therapy  Last LDL < 70  5. Need for immunization against influenza - Flu Vaccine QUAD High Dose(Fluad)   Partially dictated using Editor, commissioning. Any errors are unintentional.  Halina Maidens, MD [facility]  01/17/2020

## 2020-01-18 LAB — BASIC METABOLIC PANEL
BUN/Creatinine Ratio: 17 (ref 10–24)
BUN: 21 mg/dL (ref 8–27)
CO2: 24 mmol/L (ref 20–29)
Calcium: 9 mg/dL (ref 8.6–10.2)
Chloride: 106 mmol/L (ref 96–106)
Creatinine, Ser: 1.26 mg/dL (ref 0.76–1.27)
GFR calc Af Amer: 59 mL/min/{1.73_m2} — ABNORMAL LOW (ref >59)
GFR calc non Af Amer: 51 mL/min/{1.73_m2} — ABNORMAL LOW (ref >59)
Glucose: 187 mg/dL — ABNORMAL HIGH (ref 65–99)
Potassium: 4.8 mmol/L (ref 3.5–5.2)
Sodium: 143 mmol/L (ref 134–144)

## 2020-01-18 LAB — HEMOGLOBIN A1C
Est. average glucose Bld gHb Est-mCnc: 160 mg/dL
Hgb A1c MFr Bld: 7.2 % — ABNORMAL HIGH (ref 4.8–5.6)

## 2020-02-01 ENCOUNTER — Other Ambulatory Visit: Payer: Self-pay | Admitting: Internal Medicine

## 2020-02-01 DIAGNOSIS — E1169 Type 2 diabetes mellitus with other specified complication: Secondary | ICD-10-CM

## 2020-02-01 NOTE — Telephone Encounter (Signed)
Requested medication (s) are due for refill today: -  Requested medication (s) are on the active medication list: historical med  Last refill:  01/17/20  Future visit scheduled: yes  Notes to clinic:  historical provider   Requested Prescriptions  Pending Prescriptions Disp Refills   traMADol (ULTRAM) 50 MG tablet [Pharmacy Med Name: TRAMADOL HCL 50 MG TAB] 30 tablet     Sig: TAKE 1 TABLET BY MOUTH EVERY 6 HOURS AS NEEDED      Not Delegated - Analgesics:  Opioid Agonists Failed - 02/01/2020 10:38 AM      Failed - This refill cannot be delegated      Failed - Urine Drug Screen completed in last 360 days      Passed - Valid encounter within last 6 months    Recent Outpatient Visits           2 weeks ago Type II diabetes mellitus with complication [facility])   [facility] Glean [facility], MD   6 months ago Type II diabetes mellitus with complication [facility])   [facility]   1 year ago Type II diabetes mellitus with complication [facility])   [facility] Glean Hess, MD   1 year ago Medicare annual wellness visit, subsequent   [facility] Glean Hess, MD   1 year ago Essential hypertension   [place], Laura H, MD       Future Appointments             In 6 months Army Melia Jesse Sans, MD [facility], [facility]             Signed Prescriptions Disp Refills   atorvastatin (LIPITOR) 10 MG tablet 90 tablet 1    Sig: TAKE 1 TABLET BY MOUTH AT BEDTIME      Cardiovascular:  Antilipid - Statins Failed - 02/01/2020 10:38 AM      Failed - LDL in normal range and within 360 days    LDL Chol Calc (NIH)  Date Value Ref Range Status  07/26/2019 57 0 - 99 mg/dL Final          Failed - HDL in normal range and within 360 days    HDL  Date Value Ref Range Status  07/26/2019 37 (L) >39 mg/dL Final          Passed - Total Cholesterol in normal range and within 360 days    Cholesterol,  Total  Date Value Ref Range Status  07/26/2019 117 100 - 199 mg/dL Final          Passed - Triglycerides in normal range and within 360 days    Triglycerides  Date Value Ref Range Status  07/26/2019 126 0 - 149 mg/dL Final          Passed - Patient is not pregnant      Passed - Valid encounter within last 12 months    Recent Outpatient Visits           2 weeks ago Type II diabetes mellitus with complication [facility])   [facility] Glean [facility], MD   6 months ago Type II diabetes mellitus with complication [facility])   [facility] Glean Hess, MD   1 year ago Type II diabetes mellitus with complication [facility])   [facility] Glean Hess, MD   1 year ago Medicare annual wellness visit, subsequent   [facility]  Glean Hess, MD   1 year ago Essential hypertension   [facility] Glean Hess, MD       Future Appointments             In 6 months Army Melia Jesse Sans, MD [facility], [facility]

## 2020-02-01 NOTE — Telephone Encounter (Signed)
Requested Prescriptions  Pending Prescriptions Disp Refills   traMADol (ULTRAM) 50 MG tablet [Pharmacy Med Name: TRAMADOL HCL 50 MG TAB] 30 tablet     Sig: TAKE 1 TABLET BY MOUTH EVERY 6 HOURS AS NEEDED     Not Delegated - Analgesics:  Opioid Agonists Failed - 02/01/2020 10:38 AM      Failed - This refill cannot be delegated      Failed - Urine Drug Screen completed in last 360 days      Passed - Valid encounter within last 6 months    Recent Outpatient Visits          2 weeks ago Type II diabetes mellitus with complication [facility])   [facility] Glean [facility], MD   6 months ago Type II diabetes mellitus with complication [facility])   [facility] Glean [facility], MD   1 year ago Type II diabetes mellitus with complication [facility])   [facility] Glean Hess, MD   1 year ago Medicare annual wellness visit, subsequent   [facility] Glean Hess, MD   1 year ago Essential hypertension   [place], MD      Future Appointments            In 6 months Army Melia Jesse Sans, MD [facility], [facility]            atorvastatin (LIPITOR) 10 MG tablet [Pharmacy Med Name: ATORVASTATIN CALCIUM 10 MG TAB] 90 tablet 1    Sig: TAKE 1 TABLET BY MOUTH AT BEDTIME     Cardiovascular:  Antilipid - Statins Failed - 02/01/2020 10:38 AM      Failed - LDL in normal range and within 360 days    LDL Chol Calc (NIH)  Date Value Ref Range Status  07/26/2019 57 0 - 99 mg/dL Final         Failed - HDL in normal range and within 360 days    HDL  Date Value Ref Range Status  07/26/2019 37 (L) >39 mg/dL Final         Passed - Total Cholesterol in normal range and within 360 days    Cholesterol, Total  Date Value Ref Range Status  07/26/2019 117 100 - 199 mg/dL Final         Passed - Triglycerides in normal range and within 360 days    Triglycerides  Date Value Ref Range Status  07/26/2019 126 0 - 149 mg/dL Final          Passed - Patient is not pregnant      Passed - Valid encounter within last 12 months    Recent Outpatient Visits          2 weeks ago Type II diabetes mellitus with complication [facility])   [facility] Glean [facility], MD   6 months ago Type II diabetes mellitus with complication [facility])   [facility] Glean Hess, MD   1 year ago Type II diabetes mellitus with complication [facility])   [facility] Glean Hess, MD   1 year ago Medicare annual wellness visit, subsequent   [facility] Glean Hess, MD   1 year ago Essential hypertension   [facility] Glean Hess, MD      Future Appointments            In 6 months Army Melia Jesse Sans, MD [facility], [facility]

## 2020-02-03 NOTE — Telephone Encounter (Signed)
Please Advise. Last office visit 01/17/2020.  KP

## 2020-02-10 ENCOUNTER — Other Ambulatory Visit: Payer: Self-pay | Admitting: Internal Medicine

## 2020-02-10 DIAGNOSIS — E119 Type 2 diabetes mellitus without complications: Secondary | ICD-10-CM

## 2020-02-10 DIAGNOSIS — I10 Essential (primary) hypertension: Secondary | ICD-10-CM

## 2020-02-24 ENCOUNTER — Telehealth: Payer: Self-pay | Admitting: *Deleted

## 2020-02-24 NOTE — Chronic Care Management (AMB) (Signed)
  Chronic Care Management   Note  02/24/2020 Name: Bilal Manzer MRN: [id number] DOB: [birth year]  Oval Cavazos is a 86 y.o. year old male who is a primary care patient of Glean Hess, MD. I reached out to Yates Decamp by phone today in response to a referral sent by Mr. Marcello Moores Mcclurg's health plan.     Mr. Tirado was given information about Chronic Care Management services today including:  1. CCM service includes personalized support from designated clinical staff supervised by his physician, including individualized plan of care and coordination with other care providers 2. 24/7 contact phone numbers for assistance for urgent and routine care needs. 3. Service will only be billed when office clinical staff spend 20 minutes or more in a month to coordinate care. 4. Only one practitioner may furnish and bill the service in a calendar month. 5. The patient may stop CCM services at any time (effective at the end of the month) by phone call to the office staff. 6. The patient will be responsible for cost sharing (co-pay) of up to 20% of the service fee (after annual deductible is met).  Patient did not agree to enrollment in care management services and does not wish to consider at this time.  Follow up plan: Patient declines engagement by the care management team. The care management team is available to follow up with the patient after provider conversation with the patient regarding recommendation for care management engagement and subsequent re-referral to the care management team.   [facility]  Direct Dial [phone].

## 2020-04-03 ENCOUNTER — Other Ambulatory Visit: Payer: Self-pay | Admitting: Internal Medicine

## 2020-04-03 NOTE — Telephone Encounter (Signed)
Requested medication (s) are due for refill today  No. Last ordered 02/03/20  #30 tabs with 2 refills.  Requested medication (s) are on the active medication list Yes  Future visit scheduled Yes.  08/2020  Note to clinic-This medication is not delegated for [facility] to refill.   Requested Prescriptions  Pending Prescriptions Disp Refills   traMADol (ULTRAM) 50 MG tablet [Pharmacy Med Name: TRAMADOL HCL 50 MG TAB] 30 tablet     Sig: TAKE 1 TABLET BY MOUTH ONCE DAILY      Not Delegated - Analgesics:  Opioid Agonists Failed - 04/03/2020  6:28 PM      Failed - This refill cannot be delegated      Failed - Urine Drug Screen completed in last 360 days      Passed - Valid encounter within last 6 months    Recent Outpatient Visits           2 months ago Type II diabetes mellitus with complication [facility]   [facility] Glean Hess, MD   8 months ago Type II diabetes mellitus with complication [facility])   [facility] Glean Hess, MD   1 year ago Type II diabetes mellitus with complication [facility])   [facility] Glean Hess, MD   1 year ago Medicare annual wellness visit, subsequent   [facility] Glean Hess, MD   2 years ago Essential hypertension   [facility] Glean Hess, MD       Future Appointments             In 4 months Army Melia Jesse Sans, MD [facility], [facility]

## 2020-04-06 ENCOUNTER — Other Ambulatory Visit: Payer: Self-pay | Admitting: Internal Medicine

## 2020-04-06 DIAGNOSIS — M545 Low back pain, unspecified: Secondary | ICD-10-CM

## 2020-04-06 MED ORDER — TRAMADOL HCL 50 MG PO TABS: 50.0000 mg | ORAL_TABLET | Freq: Every day | ORAL | 2 refills | Status: DC

## 2020-05-19 ENCOUNTER — Other Ambulatory Visit: Payer: Self-pay | Admitting: Internal Medicine

## 2020-05-19 ENCOUNTER — Telehealth: Payer: Self-pay

## 2020-05-19 DIAGNOSIS — Z515 Encounter for palliative care: Secondary | ICD-10-CM

## 2020-05-19 NOTE — Telephone Encounter (Signed)
Called patients daughter Mrs. Baker and let her know that Dr. Army Melia placed a referral for palliative care and they will contact her to schedule a visit and discuss options for the patient.  Told her if she does not hear anything from them by next week to give me a call back. Also, she said patient is currently staying with her in her home.

## 2020-05-19 NOTE — Telephone Encounter (Unsigned)
Copied from [place] [id number]. Topic: General - Other >> May 19, 2020  2:32 PM Tessa Lerner A wrote: Reason for CRM: Patient's daughter has made contact requesting to speak with a member of staff on behalf of patient  Patient is interested in end-of-life planning and has concerns that they would like to address   Patient has an interest in DNR paperwork and also interested in ceasing medications  Patient is still continuing to take all medications as of 05/19/20  Please contact to advise further

## 2020-05-20 ENCOUNTER — Telehealth: Payer: Self-pay | Admitting: Primary Care

## 2020-05-21 NOTE — Telephone Encounter (Signed)
Spoke with patient's daughter, Lolita Lenz, regarding the Palliative referral/services and she was in agreement with scheduling visit.  I have scheduled an In-home Consult for 05/28/20 @ 9 AM.

## 2020-05-28 ENCOUNTER — Other Ambulatory Visit: Payer: Self-pay

## 2020-05-28 ENCOUNTER — Other Ambulatory Visit: Payer: Medicare Other | Admitting: Primary Care

## 2020-05-28 DIAGNOSIS — E118 Type 2 diabetes mellitus with unspecified complications: Secondary | ICD-10-CM | POA: Diagnosis not present

## 2020-05-28 DIAGNOSIS — N1832 Chronic kidney disease, stage 3b: Secondary | ICD-10-CM | POA: Diagnosis not present

## 2020-05-28 DIAGNOSIS — Z8551 Personal history of malignant neoplasm of bladder: Secondary | ICD-10-CM | POA: Diagnosis not present

## 2020-05-28 DIAGNOSIS — E1169 Type 2 diabetes mellitus with other specified complication: Secondary | ICD-10-CM

## 2020-05-28 DIAGNOSIS — M545 Low back pain, unspecified: Secondary | ICD-10-CM

## 2020-05-28 DIAGNOSIS — E785 Hyperlipidemia, unspecified: Secondary | ICD-10-CM | POA: Diagnosis not present

## 2020-05-28 DIAGNOSIS — Z515 Encounter for palliative care: Secondary | ICD-10-CM

## 2020-05-28 NOTE — Progress Notes (Signed)
[facility] Consult Note Telephone: [phone]  Fax: [phone]    Date of encounter: 05/28/20 PATIENT NAME: Jesus Chavez [address] [place] [address]   [phone] (home)  DOB: 03-20-1931 MRN: [id number]  PRIMARY CARE PROVIDER:    Glean Hess, MD,  [address] [place] [address] [phone]  REFERRING PROVIDER:   Glean Hess, MD [address] [place],  [place] [address] [phone]  RESPONSIBLE PARTY:    Contact Information    Name Relation Home Work Mobile   baker, [name] Daughter   [phone]      I met face to face with patient and family in home. Palliative Care was asked to follow this patient by consultation request of  Glean Hess, MD to address advance care planning and complex medical decision making. This is the initial visit.   ASSESSMENT AND RECOMMENDATIONS:   1. Advance Care Planning/Goals of Care: Goals include to maximize quality of life and symptom management. Our advance care planning conversation included a discussion about:     The value and importance of advance care planning   Experiences with loved ones who have been seriously ill or have died   Exploration of personal, cultural or spiritual beliefs that might influence medical decisions   Exploration of goals of care in the event of a sudden injury or illness   Identification and preparation of a healthcare agent   Review and updating or creation of an  advance directive document  MOST and DNR.  Decision not to resuscitate  and to  de-escalate disease focused treatments due to poor prognosis.  I met with patient and daughter in her home today. On my arrival, patient states that he no longer wants to have any medical treatments, he doesn't want to take his medicines and he no longer wants to go to medical appointment. He states his wife died a year ago  and he is ready to go now. He feels  any medical treatment and medications prolong in his life unduly.    Reviewed his current medication which he apparently stopped two weeks ago without his daughter's knowledge. We discussed  the pros and cons of this plan. I feel certainly he could stop the statin, aspirin and vitamins. We discussed the fact that stopping these medicines could actually precipitate a non-fatal event where by his function worsens and he may have to be put into a nursing home. He had not considered that morbidity, not mortality,  could be the outcome of his plan. MOST created with DNR, comfort measures, use of abx, no iv, no feeding tube.  He also wants to De-escalate his medical appointments and stop going to the hospital. He was adamant he did not want to [facility] to [facility] but was open to them coming to him. For that reason I would suggest that he get a home-based primary provider If video telemedicine is not practical with the current provider. Resources were given for nurse practitioner home-based primary care practice.  I spent 30 minutes providing this consultation,  from 0915 to 0945.  More than 50% of the time in this consultation was spent in counseling and care coordination. ________________________________________________________   2. Symptom Management:   1. Type II diabetes mellitus with complication (HCC) - Clinically stable by exam and report without s/s of hypoglycemia. - He does not check his BS. - Hemoglobin A1c in Nov 22 was 7.2  - Has  stopped taking his medication approximately 2 weeks ago but he is agreeable to restarting this. He enjoys drinking one 12 oz can of regular coke per day and eating what he likes. We discuss the risks of stopping his medications and that he may feel poorly or have a worse health event. He enjoys the freedom of having ocassional treats, and I fear that his BG would become high and negatively impact his quality of life  - He plans to continue to take his medications  at this time, but would not want to escalate medical interventions.  ----------------------------------------------------------------------------------------------------------------------------------------------------------------------------------------------------  2. Type 2 diabetes mellitus with vascular disease ([place])  - HTN historically  well controlled on appropriate ACEI. He has stopped taking meds but has agreed to return to his protocol.   3. Stage 3b chronic kidney disease ([place]) Continue to monitor- last BMP very stable,  creat 1.26  4. Hyperlipidemia associated with type 2 diabetes mellitus ([place]) On high intensity statin therapy  Last LDL < 70 We discussed that he may be able to come off this medication as he is hoping to transition to more comfort based care . I recommend to d/c statin therapy as finished with goal of cv prevention and palliative goals now.  5. Depression  Wife died approx 1 year ago, and he has had some depressive symptoms.These have not been previously treated.  PHQ 2 = 6. I will start him on SSRI, 25 mg sertraline x 2 weeks then increase to 50 mg daily. Will reassess in 4-5 weeks and daughter will call if there are untoward side effects.  Zoloft low dose.   6. Chronic back pain  He has come off his tramadol x2 weeks. He did not experience any withdrawal symptoms, and he denies pain. He will continue to stay off this and treat pain with acetaminophen cr 650 mg q 8 hr PRN   3. Follow up Palliative Care Visit: Palliative care will continue to follow for goals of care clarification and symptom management. Return 6 4-5eeks or prn.  4. Family /Caregiver/Community Supports: Patient has been living with his daughter since last month. His wife died last 05/21/22.   5. Cognitive / Functional decline: Uses a walker when out of the house, but does not use anything around the house. He uses grippy socks around the house for fall prevention. Last fall 3-4 months ago. He  does his own ADLs .Has some signs of early dementia.  Very HOH which impacts communication.  CODE STATUS: DNR   PPS: 50%  HOSPICE ELIGIBILITY/DIAGNOSIS: no  CHIEF COMPLAINT:  depression  HISTORY OF PRESENT ILLNESS:  Jesus Chavez is a 87 y.o. year old male  with a PMH of DMII, stage IIIb CKD, and hyperlipidemia and depression. He was referred to palliative care for advanced care planning and addressing end of life symptom management. . He has been struggling with taking his medications ahd he stopped taking them approx 2 weeks ago, but he did not tell his daughter he stopped those medications. He states he is tired of , f/u with going out to MD appts, and he wanted to die or at least not prolong his life.  Voices depression at the death of his wife a year ago. Does not like to do activities or go out. Does not find joy in life. He is willing to try anti depressant to see if this might improve his mood and out look.    Review and summarization of old 66 records shows or history  from other than patient, including family members records reviewed in EPIC Review or lab tests reviewed, a1c= 6.5%  and chemistries, creatinine  1.26, GFR 59 most recent Plan to request records from PCP and previous health systems prior to coming to area, [facility] or [place].    CURRENT PROBLEM LIST:  Patient Active Problem List   Diagnosis Date Noted  . Stage 3b chronic kidney disease ([place]) 12/14/2018  . Hyperlipidemia associated with type 2 diabetes mellitus ([place]) 11/13/2017  . Lumbar back pain 08/14/2017  . Essential hypertension 08/14/2017  . Type II diabetes mellitus with complication ([place]) 38/17/7116  . History of bladder cancer 08/14/2017  . Tobacco use disorder, moderate, in sustained remission, dependence 08/14/2017  . History of prostate cancer 08/14/2017   PAST MEDICAL HISTORY:  Active Ambulatory Problems    Diagnosis Date Noted  . Lumbar back pain 08/14/2017  . Essential hypertension 08/14/2017   . Type II diabetes mellitus with complication ([place]) 57/90/3833  . History of bladder cancer 08/14/2017  . Tobacco use disorder, moderate, in sustained remission, dependence 08/14/2017  . History of prostate cancer 08/14/2017  . Hyperlipidemia associated with type 2 diabetes mellitus ([place]) 11/13/2017  . Stage 3b chronic kidney disease ([place]) 12/14/2018   Resolved Ambulatory Problems    Diagnosis Date Noted  . No Resolved Ambulatory Problems   Past Medical History:  Diagnosis Date  . Back pain   . Diabetes ([place])    SOCIAL HX:  Social History   Tobacco Use  . Smoking status: Former Smoker    Packs/day: 1.00    Years: 60.00    Pack years: 60.00    Types: Cigarettes    Quit date: 2011    Years since quitting: 11.2  . Smokeless tobacco: Never Used  Substance Use Topics  . Alcohol use: Never   FAMILY HX:  Family History  Problem Relation Age of Onset  . Heart Problems Mother        82.75 yr old  . Hypertension Father      ALLERGIES: No Known Allergies   PERTINENT MEDICATIONS:  Outpatient Encounter Medications as of 05/28/2020  Medication Sig  . acetaminophen (TYLENOL) 650 MG CR tablet Take 650 mg by mouth every 8 (eight) hours as needed for pain.  Marland Kitchen glyBURIDE (DIABETA) 5 MG tablet TAKE 1 TABLET BY MOUTH 3 TIMES DAILY  . Multiple Vitamins-Minerals ([employer] E[employer] HEALTH & LUTEIN) TABS Take 1 tablet by mouth daily.  . quinapril (ACCUPRIL) 40 MG tablet TAKE 1 TABLET BY MOUTH TWICE DAILY  . sertraline (ZOLOFT) 50 MG tablet Take 50 mg by mouth daily. Take 1/2 tablet daily for 2 weeks then increase to 50 mg daily.  Marland Kitchen aspirin EC 81 MG tablet Take 81 mg by mouth daily. (Patient not taking: Reported on 05/28/2020)  . atorvastatin (LIPITOR) 10 MG tablet TAKE 1 TABLET BY MOUTH AT BEDTIME (Patient not taking: Reported on 05/28/2020)  . traMADol (ULTRAM) 50 MG tablet Take 1 tablet (50 mg total) by mouth daily. (Patient not taking: Reported on 05/28/2020)  . [DISCONTINUED] UNABLE TO FIND Med  Name: lutein (Patient not taking: Reported on 05/28/2020)   No facility-administered encounter medications on file as of 05/28/2020.    ROS General: NAD EYES: denies vision changes, wears glasses ENMT: denies dysphagia, very  HOH Cardiovascular: denies chest pain, increased DOE Pulmonary: denies cough, denies increased SOB Abdomen: endorses good appetite, denies constipation, endorses continence of bowel GU: denies dysuria, endorses continence of urine, some hesitancy  MSK:  endorses  ROM limitations, no falls reported Skin: denies rashes or wounds Neurological: endorses weakness, denies pain, denies insomnia Psych: Endorses down  mood Heme/lymph/immuno: denies bruises, abnormal bleeding  Physical Exam: Current and past weights: most recent weight approx 181 lbs, pt states this is his baseline Constitutional: NAD General: frail appearing, WNWD EYES: anicteric sclera, lids intact, no discharge  ENMT: deficits in bil  hearing,oral mucous membranes moist, dentition intact CV: no LE edema Pulmonary: no increased work of breathing, no cough, no audible wheezes, room air Abdomen: intake 75%,,no ascites GU: deferred MSK: mild sarcopenia, decreased ROM in all extremities,  Ambulatory with walker going out, (I) at home Skin: warm and dry, no rashes or wounds on visible skin Neuro: Generalized weakness, mild cognitive impairment noted Psych: non-anxious affect, A and O x 4 Hem/lymph/immuno: no widespread bruising   Thank you for the opportunity to participate in the care of Mr. Demir.  The palliative care team will continue to follow. Please call our office at [phone] if we can be of additional assistance.  This visit was coded based on medical decision making (MDM).    Jason Coop, NP , DNP, MPH, AGPCNP-BC, ACHPN   COVID-19 PATIENT SCREENING TOOL    Person answering questions: _______Pt/daughter____________   1.  Is the patient or any family member in the home  showing any signs or symptoms regarding respiratory infection?                  Person with Symptom  ______________na___________ a. Fever/chills/headache                                                        Yes___ No__X_            b. Shortness of breath                                                            Yes___ No__X_           c. Cough/congestion                                               Yes___  No__X_          d. Muscle/Body aches/pains                                                   Yes___ No__X_         e. Gastrointestinal symptoms (diarrhea,nausea)             Yes___ No__X_         f. Sudden loss of smell or taste      Yes___ No__X_        2. Within the past 10 days, has anyone living in the home had any contact with someone with or under investigation for COVID-19?  Yes___ No__X__   Person __________________

## 2020-07-02 ENCOUNTER — Other Ambulatory Visit: Payer: Medicare Other | Admitting: Primary Care

## 2020-08-07 ENCOUNTER — Other Ambulatory Visit: Payer: Self-pay

## 2020-08-07 ENCOUNTER — Encounter: Payer: Medicare Other | Admitting: Internal Medicine

## 2020-08-07 ENCOUNTER — Emergency Department: Payer: Medicare Other

## 2020-08-07 ENCOUNTER — Emergency Department
Admission: EM | Admit: 2020-08-07 | Discharge: 2020-08-07 | Disposition: A | Discharge status: Home / Self Care | Payer: Medicare Other | Attending: Emergency Medicine | Admitting: Emergency Medicine

## 2020-08-07 DIAGNOSIS — R0602 Shortness of breath: Secondary | ICD-10-CM | POA: Diagnosis not present

## 2020-08-07 DIAGNOSIS — R404 Transient alteration of awareness: Secondary | ICD-10-CM | POA: Diagnosis not present

## 2020-08-07 DIAGNOSIS — I129 Hypertensive chronic kidney disease with stage 1 through stage 4 chronic kidney disease, or unspecified chronic kidney disease: Secondary | ICD-10-CM | POA: Diagnosis not present

## 2020-08-07 DIAGNOSIS — E785 Hyperlipidemia, unspecified: Secondary | ICD-10-CM | POA: Diagnosis not present

## 2020-08-07 DIAGNOSIS — R52 Pain, unspecified: Secondary | ICD-10-CM | POA: Diagnosis not present

## 2020-08-07 DIAGNOSIS — Z8546 Personal history of malignant neoplasm of prostate: Secondary | ICD-10-CM | POA: Insufficient documentation

## 2020-08-07 DIAGNOSIS — Z79899 Other long term (current) drug therapy: Secondary | ICD-10-CM | POA: Diagnosis not present

## 2020-08-07 DIAGNOSIS — N1832 Chronic kidney disease, stage 3b: Secondary | ICD-10-CM | POA: Diagnosis not present

## 2020-08-07 DIAGNOSIS — E1122 Type 2 diabetes mellitus with diabetic chronic kidney disease: Secondary | ICD-10-CM | POA: Diagnosis not present

## 2020-08-07 DIAGNOSIS — R000 Tachycardia, unspecified: Secondary | ICD-10-CM | POA: Diagnosis not present

## 2020-08-07 DIAGNOSIS — Z8551 Personal history of malignant neoplasm of bladder: Secondary | ICD-10-CM | POA: Insufficient documentation

## 2020-08-07 DIAGNOSIS — E1169 Type 2 diabetes mellitus with other specified complication: Secondary | ICD-10-CM | POA: Insufficient documentation

## 2020-08-07 DIAGNOSIS — Z87891 Personal history of nicotine dependence: Secondary | ICD-10-CM | POA: Diagnosis not present

## 2020-08-07 DIAGNOSIS — R410 Disorientation, unspecified: Secondary | ICD-10-CM | POA: Diagnosis not present

## 2020-08-07 DIAGNOSIS — R069 Unspecified abnormalities of breathing: Secondary | ICD-10-CM | POA: Diagnosis not present

## 2020-08-07 DIAGNOSIS — J189 Pneumonia, unspecified organism: Secondary | ICD-10-CM

## 2020-08-07 DIAGNOSIS — R0902 Hypoxemia: Secondary | ICD-10-CM | POA: Diagnosis not present

## 2020-08-07 DIAGNOSIS — J181 Lobar pneumonia, unspecified organism: Secondary | ICD-10-CM | POA: Diagnosis not present

## 2020-08-07 LAB — URINALYSIS, COMPLETE (UACMP) WITH MICROSCOPIC
Bacteria, UA: NONE SEEN
Bilirubin Urine: NEGATIVE
Glucose, UA: 50 mg/dL — AB
Hgb urine dipstick: NEGATIVE
Ketones, ur: NEGATIVE mg/dL
Leukocytes,Ua: NEGATIVE
Nitrite: NEGATIVE
Protein, ur: 100 mg/dL — AB
Specific Gravity, Urine: 1.016 (ref 1.005–1.030)
pH: 5 (ref 5.0–8.0)

## 2020-08-07 LAB — COMPREHENSIVE METABOLIC PANEL
ALT: 29 U/L (ref 0–44)
AST: 37 U/L (ref 15–41)
Albumin: 3.5 g/dL (ref 3.5–5.0)
Alkaline Phosphatase: 135 U/L — ABNORMAL HIGH (ref 38–126)
Anion gap: 15 (ref 5–15)
BUN: 37 mg/dL — ABNORMAL HIGH (ref 8–23)
CO2: 17 mmol/L — ABNORMAL LOW (ref 22–32)
Calcium: 8.8 mg/dL — ABNORMAL LOW (ref 8.9–10.3)
Chloride: 108 mmol/L (ref 98–111)
Creatinine, Ser: 1.6 mg/dL — ABNORMAL HIGH (ref 0.61–1.24)
GFR, Estimated: 41 mL/min — ABNORMAL LOW (ref >60)
Glucose, Bld: 216 mg/dL — ABNORMAL HIGH (ref 70–99)
Potassium: 4.3 mmol/L (ref 3.5–5.1)
Sodium: 140 mmol/L (ref 135–145)
Total Bilirubin: 1 mg/dL (ref 0.3–1.2)
Total Protein: 7.5 g/dL (ref 6.5–8.1)

## 2020-08-07 LAB — CBC WITH DIFFERENTIAL/PLATELET
Abs Immature Granulocytes: 0.05 10*3/uL (ref 0.00–0.07)
Basophils Absolute: 0 10*3/uL (ref 0.0–0.1)
Basophils Relative: 0 %
Eosinophils Absolute: 0 10*3/uL (ref 0.0–0.5)
Eosinophils Relative: 0 %
HCT: 51.7 % (ref 39.0–52.0)
Hemoglobin: 17.1 g/dL — ABNORMAL HIGH (ref 13.0–17.0)
Immature Granulocytes: 0 %
Lymphocytes Relative: 3 %
Lymphs Abs: 0.6 10*3/uL — ABNORMAL LOW (ref 0.7–4.0)
MCH: 30.9 pg (ref 26.0–34.0)
MCHC: 33.1 g/dL (ref 30.0–36.0)
MCV: 93.5 fL (ref 80.0–100.0)
Monocytes Absolute: 0.9 10*3/uL (ref 0.1–1.0)
Monocytes Relative: 5 %
Neutro Abs: 15 10*3/uL — ABNORMAL HIGH (ref 1.7–7.7)
Neutrophils Relative %: 92 %
Platelets: 158 10*3/uL (ref 150–400)
RBC: 5.53 MIL/uL (ref 4.22–5.81)
RDW: 13.4 % (ref 11.5–15.5)
WBC: 16.5 10*3/uL — ABNORMAL HIGH (ref 4.0–10.5)
nRBC: 0 % (ref 0.0–0.2)

## 2020-08-07 MED ORDER — LACTATED RINGERS IV BOLUS
1000.0000 mL | Freq: Once | INTRAVENOUS | Status: AC
  Administered 2020-08-07: 1000 mL via INTRAVENOUS

## 2020-08-07 MED ORDER — CEFDINIR 300 MG PO CAPS
300.0000 mg | ORAL_CAPSULE | Freq: Once | ORAL | Status: AC
  Administered 2020-08-07: 300 mg via ORAL
  Filled 2020-08-07: qty 1

## 2020-08-07 MED ORDER — CEFDINIR 300 MG PO CAPS: 300.0000 mg | ORAL_CAPSULE | Freq: Two times a day (BID) | ORAL | 0 refills | Status: DC

## 2020-08-07 NOTE — ED Provider Notes (Signed)
[facility] Emergency Department Provider Note ____________________________________________   Event Date/Time   First MD Initiated Contact with Patient 08/07/20 1925     (approximate)  I have reviewed the triage vital signs and the nursing notes.  HISTORY  Chief Complaint Altered Mental Status (Pt arrives to ED from home for increased confusion. Pt resides w/daughter & son-in-law who reports patient recently switched to palliative care & has not been taking daily meds. Hx of UTI's, pt denies pain/buring w/urination. Hx of HTN, ca, DM2, pt is hard of hearing.)   HPI Jesus Chavez is a 87 y.o. malewho presents to the ED for evaluation of confusion.  Chart review indicates history of DM on oral agents, HLD.  CKD.  Depression with his wife passing about a year ago. Established with palliative care 2 months ago is now DNR status.    Patient presents to the ED from home via EMS for evaluation of increased weakness, confusion and shortness of breath of the past few days.  Patient is disoriented and unable to provide any relevant history.  Denies any pain and reports feeling okay right now.  Majority of history is obtained from the daughter who arrived to the ED shortly after him.  She reports that he has been more weak, trembling and appeared short of breath for the past couple days.  Denies any known fevers at home, syncopal episodes or falls.  She reports concern for UTI.  Review of patient's MOST form, which daughter brings with her, demonstrates patient wants antibiotics and medications, but no other interventions and no aggressive interventions.  He does have marks that he does not want IV fluids, but I discussed with him and his daughter my recommendations for IV fluids due to his apparent dehydration, and they are agreeable.  We discussed the possibility of intracranial pathology leading to his confusion, but daughter and patient agree that they would not want  intervention and therefore we agreed not to perform CT head.  Similarly, would not want intervention regarding his heart and I agree not to perform heart testing.  Largely requesting evaluation for UTI, pneumonia and basic blood work.  Past Medical History:  Diagnosis Date  . Back pain   . Diabetes [facility])     Patient Active Problem List   Diagnosis Date Noted  . Stage 3b chronic kidney disease ([place]) 12/14/2018  . Hyperlipidemia associated with type 2 diabetes mellitus ([place]) 11/13/2017  . Lumbar back pain 08/14/2017  . Essential hypertension 08/14/2017  . Type II diabetes mellitus with complication ([place]) 74/94/4967  . History of bladder cancer 08/14/2017  . Tobacco use disorder, moderate, in sustained remission, dependence 08/14/2017  . History of prostate cancer 08/14/2017    Past Surgical History:  Procedure Laterality Date  . BLADDER SURGERY  2007   CANCER REMOVAL   . PROSTATE SURGERY  1997   CANCER REMOVAL   . TONSILECTOMY, ADENOIDECTOMY, BILATERAL MYRINGOTOMY AND TUBES      Prior to Admission medications   Medication Sig Start Date End Date Taking? Authorizing Provider  cefdinir (OMNICEF) 300 MG capsule Take 1 capsule (300 mg total) by mouth 2 (two) times daily for 7 days. 08/07/20 08/14/20 Yes Vladimir Crofts, MD  acetaminophen (TYLENOL) 650 MG CR tablet Take 650 mg by mouth every 8 (eight) hours as needed for pain.    [provider]  aspirin EC 81 MG tablet Take 81 mg by mouth daily. Patient not taking: Reported on 05/28/2020    [provider]  atorvastatin (LIPITOR) 10 MG tablet TAKE 1 TABLET BY MOUTH AT BEDTIME Patient not taking: Reported on 05/28/2020 02/01/20   Glean Hess, MD  glyBURIDE (DIABETA) 5 MG tablet TAKE 1 TABLET BY MOUTH 3 TIMES DAILY 02/10/20   Glean Hess, MD  Multiple Vitamins-Minerals ([facility] [facility] LUTEIN) TABS Take 1 tablet by mouth daily.    [provider]  quinapril (ACCUPRIL) 40 MG tablet TAKE 1 TABLET BY MOUTH  TWICE DAILY 02/10/20   Glean Hess, MD  sertraline (ZOLOFT) 50 MG tablet Take 50 mg by mouth daily. Take 1/2 tablet daily for 2 weeks then increase to 50 mg daily.    [provider]  traMADol (ULTRAM) 50 MG tablet Take 1 tablet (50 mg total) by mouth daily. Patient not taking: Reported on 05/28/2020 04/06/20   Glean Hess, MD    Allergies Patient has no known allergies.  Family History  Problem Relation Age of Onset  . Heart Problems Mother        69.75 yr old  . Hypertension Father     Social History Social History   Tobacco Use  . Smoking status: Former Smoker    Packs/day: 1.00    Years: 60.00    Pack years: 60.00    Types: Cigarettes    Quit date: 2011    Years since quitting: 11.4  . Smokeless tobacco: Never Used  Vaping Use  . Vaping Use: Never used  Substance Use Topics  . Alcohol use: Never  . Drug use: Never    Review of Systems  Constitutional: No fever/chills.  Positive for confusion Eyes: No visual changes. ENT: No sore throat. Cardiovascular: Denies chest pain. Respiratory: Positive for shortness of breath. Gastrointestinal: No abdominal pain.  No nausea, no vomiting.  No diarrhea.  No constipation. Genitourinary: Negative for dysuria. Musculoskeletal: Negative for back pain. Skin: Negative for rash. Neurological: Negative for headaches, focal weakness or numbness.  ____________________________________________   PHYSICAL EXAM:  VITAL SIGNS: Vitals:   08/07/20 2230 08/07/20 2300  BP: (!) 141/76 (!) 145/80  Pulse: (!) 105 (!) 101  Resp: 16 (!) 21  Temp:    SpO2: 96% 95%     Constitutional: Alert and pleasantly disoriented. Well appearing and in no acute distress.  Hard of hearing. Eyes: Conjunctivae are normal. PERRL. EOMI. Head: Atraumatic. Nose: No congestion/rhinnorhea. Mouth/Throat: Mucous membranes are dry.  Oropharynx non-erythematous. Neck: No stridor. No cervical spine tenderness to palpation. Cardiovascular:  Tachycardic rate, regular rhythm. Grossly normal heart sounds.  Good peripheral circulation. Respiratory: Mild tachypnea to the low 20s.  No further evidence of distress.  No retractions. Lungs CTAB. Gastrointestinal: Soft , nondistended, nontender to palpation. No CVA tenderness. Musculoskeletal: No lower extremity tenderness nor edema.  No joint effusions. No signs of acute trauma. Neurologic:  Normal speech and language. No gross focal neurologic deficits are appreciated. Skin:  Skin is warm, dry and intact. No rash noted. Psychiatric: Mood and affect are normal. Speech and behavior are normal.  ____________________________________________   LABS (all labs ordered are listed, but only abnormal results are displayed)  Labs Reviewed  COMPREHENSIVE METABOLIC PANEL - Abnormal; Notable for the following components:      Result Value   CO2 17 (*)    Glucose, Bld 216 (*)    BUN 37 (*)    Creatinine, Ser 1.60 (*)    Calcium 8.8 (*)    Alkaline Phosphatase 135 (*)    GFR, Estimated 41 (*)  All other components within normal limits  CBC WITH DIFFERENTIAL/PLATELET - Abnormal; Notable for the following components:   WBC 16.5 (*)    Hemoglobin 17.1 (*)    Neutro Abs 15.0 (*)    Lymphs Abs 0.6 (*)    All other components within normal limits  URINALYSIS, COMPLETE (UACMP) WITH MICROSCOPIC - Abnormal; Notable for the following components:   Color, Urine YELLOW (*)    APPearance CLEAR (*)    Glucose, UA 50 (*)    Protein, ur 100 (*)    All other components within normal limits    ____________________________________________  RADIOLOGY  ED MD interpretation: 2 view CXR reviewed by me with left basilar opacity concerning for CAP  Official radiology report(s): DG Chest 2 View  Result Date: 08/07/2020 CLINICAL DATA:  Shortness of breath and tachycardia EXAM: CHEST - 2 VIEW COMPARISON:  None. FINDINGS: There is ill-defined opacity in the medial left base. Lungs elsewhere clear. Heart  size and pulmonary vascularity are normal. No adenopathy. There is aortic atherosclerosis. There is degenerative change in the thoracic spine. IMPRESSION: Ill-defined opacity medial left base, likely representing a developing pneumonia. Lungs elsewhere clear. Cardiac silhouette. Aortic Atherosclerosis (ICD10-I70.0). Electronically Signed   By: Lowella Grip III M.D.   On: 08/07/2020 20:23    ____________________________________________   PROCEDURES and INTERVENTIONS  Procedure(s) performed (including Critical Care):  .1-3 Lead EKG Interpretation Performed by: Vladimir Crofts, MD Authorized by: Vladimir Crofts, MD     Interpretation: abnormal     ECG rate:  110   ECG rate assessment: tachycardic     Rhythm: sinus tachycardia     Ectopy: none     Conduction: normal      Medications  lactated ringers bolus 1,000 mL (0 mLs Intravenous Stopped 08/07/20 2100)  cefdinir (OMNICEF) capsule 300 mg (300 mg Oral Given 08/07/20 2148)  lactated ringers bolus 1,000 mL (1,000 mLs Intravenous New Bag/Given 08/07/20 2245)    ____________________________________________   MDM / ED COURSE   87 year old male with DNR in place presents to the ED with increased confusion and shortness of breath, with evidence of community-acquired pneumonia and dehydration, ultimately amenable to outpatient management.  Tachycardic but hemodynamically stable and not hypoxic.  Improving with IV fluids.  Stigmata of dehydration on exam.  Mild tachypnea is noted.  CXR with developing infiltrate to the left base as the likely source of his symptoms.  Blood work with metabolic acidosis and stigmata of dehydration.  He received 2 L of IV fluids and started on antibiotics after shared decision-making with the patient's daughter and in conjunction with his MOST form.  Return precautions for the ED were discussed and patient is stable for outpatient management with custody of daughter.   Clinical Course as of 08/07/20 2320  Fri Aug 07, 2020  1956 Long discussion at the bedside with patient and his daughter. [DS]  2125 Reassessed.  Tachycardia improving after IV fluids and oral fluids.  Patient reports feeling better.  We discussed diagnosis of pneumonia and my recommendation for antibiotics, patient and daughter are agreeable. [DS]    Clinical Course User Index [DS] Vladimir Crofts, MD    ____________________________________________   FINAL CLINICAL IMPRESSION(S) / ED DIAGNOSES  Final diagnoses:  Community acquired pneumonia of left lower lobe of lung     ED Discharge Orders         Ordered    cefdinir (OMNICEF) 300 MG capsule  2 times daily        08/07/20  2115           Vladimir Crofts   Note:  This document was prepared using Dragon voice recognition software and may include unintentional dictation errors.   Vladimir Crofts, MD 08/07/20 2325

## 2020-08-07 NOTE — Discharge Instructions (Signed)
Mr. Ohms has signs of a small pneumonia in the left side of his lungs.   He is being discharged with a prescription for Omnicef antibiotics to treat this. Please take twice daily for the next 7 days, and finish all 14 pills, even if he is getting better.   If you feel like he is getting worse, please return to the ED.

## 2020-08-10 ENCOUNTER — Other Ambulatory Visit: Payer: Self-pay

## 2020-08-10 ENCOUNTER — Encounter: Payer: Self-pay | Admitting: Internal Medicine

## 2020-08-11 ENCOUNTER — Other Ambulatory Visit: Payer: Self-pay

## 2020-08-11 ENCOUNTER — Encounter: Payer: Self-pay | Admitting: Primary Care

## 2020-08-11 DIAGNOSIS — Z111 Encounter for screening for respiratory tuberculosis: Secondary | ICD-10-CM

## 2020-08-13 ENCOUNTER — Telehealth: Payer: Self-pay | Admitting: Internal Medicine

## 2020-08-13 NOTE — Telephone Encounter (Signed)
Called to request that orders be sent in order for patient to get labs.  Please send to [facility], Fax# [phone], in order for them to draw labs.

## 2020-08-13 NOTE — Telephone Encounter (Signed)
Spoke to pts daughter Opal Sidles she stated that someone is coming out tomorrow morning to draw pts blood.  KP

## 2020-08-14 ENCOUNTER — Other Ambulatory Visit: Payer: Medicare Other | Admitting: Primary Care

## 2020-08-14 ENCOUNTER — Ambulatory Visit: Payer: Self-pay | Admitting: Internal Medicine

## 2020-08-14 ENCOUNTER — Other Ambulatory Visit: Payer: Self-pay

## 2020-08-14 ENCOUNTER — Encounter: Payer: Medicare Other | Admitting: Internal Medicine

## 2020-08-14 DIAGNOSIS — E118 Type 2 diabetes mellitus with unspecified complications: Secondary | ICD-10-CM | POA: Diagnosis not present

## 2020-08-14 DIAGNOSIS — Z111 Encounter for screening for respiratory tuberculosis: Secondary | ICD-10-CM | POA: Diagnosis not present

## 2020-08-14 DIAGNOSIS — Z515 Encounter for palliative care: Secondary | ICD-10-CM | POA: Diagnosis not present

## 2020-08-14 DIAGNOSIS — N1832 Chronic kidney disease, stage 3b: Secondary | ICD-10-CM | POA: Diagnosis not present

## 2020-08-14 NOTE — Progress Notes (Addendum)
[facility] Consult Note Telephone: [phone]  Fax: [phone]    Date of encounter: 08/14/20 PATIENT NAME: Jesus Chavez [address] [place] [facility] [address]   [phone] (home)  DOB: 1931/04/03 MRN: [id number] PRIMARY CARE PROVIDER:    Glean Hess, MD,  [address] [place] [address] [phone]  REFERRING PROVIDER:   Glean Hess, MD [address] [place],  [place] [address] [phone]  RESPONSIBLE PARTY:    Contact Information     Name Relation Home Work Mobile   baker, [name] Daughter   [phone]        I met face to face with patient and family in the home. Palliative Care was asked to follow this patient by consultation request of  Glean Hess, MD to address advance care planning and complex medical decision making. This is a follow up visit.                                   ASSESSMENT AND PLAN / RECOMMENDATIONS:   Advance Care Planning/Goals of Care: Goals include to maximize quality of life and symptom management. Our advance care planning conversation included a discussion about:      Exploration of personal, cultural or spiritual beliefs that might influence medical decisions  Exploration of goals of care in the event of a sudden injury or illness  Review  of an  advance directive document= no changes CODE STATUS: DNR, full comfort measures  Symptom Management/Plan:  Patient being worked up to move to ALF. Today I have done FL2 and ALF supplemental FL2 and emailed to pt famiy and L Johnson at [facility]. Home lab has performed quantiferon altho imaging on 08/07/20 shows no evidence of TB reported in cxr. He also has a pharmacy coming to do in home covid 19 booster and hopefully Tdap since last one is not recorded.  Pt function has decreased some since CAP episode. He needs more monitoring and cannot be left alone. He and family have decided to have him move  to ALF. He is HOH but able to conduct interview with me.  He is using a walker rollator currently but may be better served by standard walker. He does have some trouble with the brakes per daughter. I have also asked for home health PT referral from [facility].   Family would like f/u at [facility] for [facility]. Will reach out to new PCP [facility] for request.  Follow up Palliative Care Visit: Palliative care will continue to follow for complex medical decision making, advance care planning, and clarification of goals. Return 4 weeks or prn.  I spent 60 minutes providing this consultation. More than 50% of the time in this consultation was spent in counseling and care coordination.  PPS: 50%  HOSPICE ELIGIBILITY/DIAGNOSIS: no  Chief Complaint: debility  HISTORY OF PRESENT ILLNESS:  Clemmie Marxen is a 87 y.o. year old male  with DM, lumbar pain, debility, risk for falls.   History obtained from review of EMR, discussion with primary team, and interview with family, facility staff/caregiver and/or Jesus Chavez.  I reviewed available labs, medications, imaging, studies and related documents from the EMR.  Records reviewed and summarized above.   ROS   General: NAD EYES: denies vision changes, has glasses, needs eye exam, none in 3 years. ENMT: denies dysphagia Cardiovascular: denies chest pain, denies DOE Pulmonary: denies cough,  denies increased SOB Abdomen: endorses good appetite, endorses constipation, endorses continence of bowel GU: denies dysuria, endorses continence of urine MSK:  endorses weakness,  no falls reported Skin: denies rashes or wounds Neurological: endorses occ  pain, denies insomnia Psych: Endorses positive mood Heme/lymph/immuno: denies bruises, abnormal bleeding  Physical Exam: Current and past weights: 180 lbs, stable Constitutional: NAD General: frail appearing, WNWD EYES: anicteric sclera, lids intact, no discharge  ENMT: hard of hearing, oral mucous membranes moist,  dentures CV: S1S2, RRR, 1-2+ LE edema Pulmonary: LCTA, no increased work of breathing, no cough, room air Abdomen: intake 100%, normo-active BS + 4 quadrants, soft and non tender, no ascites GU: deferred MSK: mild  sarcopenia, moves all extremities, ambulatory with walker Skin: warm and over dry, no rashes or wounds on visible skin, feet intact, no wounds, but thick nails. Neuro:  + generalized weakness, cranial nerves grossly intact except hearing. Psych: non-anxious affect, A and O x 3 Hem/lymph/immuno: no widespread bruising  Patient Active Problem List   Diagnosis Date Noted   Stage 3b chronic kidney disease ([place]) 12/14/2018   Hyperlipidemia associated with type 2 diabetes mellitus ([place]) 11/13/2017   Lumbar back pain 08/14/2017   Essential hypertension 08/14/2017   Type II diabetes mellitus with complication ([place]) 00/86/7619   History of bladder cancer 08/14/2017   Tobacco use disorder, moderate, in sustained remission, dependence 08/14/2017   History of prostate cancer 08/14/2017   Thank you for the opportunity to participate in the care of Mr. Rhew.  The palliative care team will continue to follow. Please call our office at [phone] if we can be of additional assistance.   Jason Coop, NP , DNP, MPH, AGPCNP-BC, ACHPN  COVID-19 PATIENT SCREENING TOOL Asked and negative response unless otherwise noted:   Have you had symptoms of covid, tested positive or been in contact with someone with symptoms/positive test in the past 5-10 days?

## 2020-08-17 NOTE — Telephone Encounter (Signed)
Pt response.  KP

## 2020-08-19 ENCOUNTER — Inpatient Hospital Stay
Admission: EM | Admit: 2020-08-19 | Discharge: 2020-09-04 | DRG: 951 | Disposition: E | Payer: Medicare Other | Source: Skilled Nursing Facility | Attending: Internal Medicine | Admitting: Internal Medicine

## 2020-08-19 ENCOUNTER — Emergency Department: Payer: Medicare Other

## 2020-08-19 DIAGNOSIS — R0781 Pleurodynia: Secondary | ICD-10-CM

## 2020-08-19 DIAGNOSIS — Z515 Encounter for palliative care: Principal | ICD-10-CM

## 2020-08-19 DIAGNOSIS — I10 Essential (primary) hypertension: Secondary | ICD-10-CM | POA: Diagnosis not present

## 2020-08-19 DIAGNOSIS — J1282 Pneumonia due to coronavirus disease 2019: Secondary | ICD-10-CM | POA: Diagnosis present

## 2020-08-19 DIAGNOSIS — A4189 Other specified sepsis: Secondary | ICD-10-CM | POA: Diagnosis present

## 2020-08-19 DIAGNOSIS — E1122 Type 2 diabetes mellitus with diabetic chronic kidney disease: Secondary | ICD-10-CM | POA: Diagnosis present

## 2020-08-19 DIAGNOSIS — N1831 Chronic kidney disease, stage 3a: Secondary | ICD-10-CM | POA: Diagnosis present

## 2020-08-19 DIAGNOSIS — Z87891 Personal history of nicotine dependence: Secondary | ICD-10-CM | POA: Diagnosis not present

## 2020-08-19 DIAGNOSIS — R627 Adult failure to thrive: Secondary | ICD-10-CM | POA: Diagnosis present

## 2020-08-19 DIAGNOSIS — R6520 Severe sepsis without septic shock: Secondary | ICD-10-CM

## 2020-08-19 DIAGNOSIS — Z8546 Personal history of malignant neoplasm of prostate: Secondary | ICD-10-CM | POA: Diagnosis not present

## 2020-08-19 DIAGNOSIS — J189 Pneumonia, unspecified organism: Secondary | ICD-10-CM

## 2020-08-19 DIAGNOSIS — E119 Type 2 diabetes mellitus without complications: Secondary | ICD-10-CM | POA: Diagnosis not present

## 2020-08-19 DIAGNOSIS — I129 Hypertensive chronic kidney disease with stage 1 through stage 4 chronic kidney disease, or unspecified chronic kidney disease: Secondary | ICD-10-CM | POA: Diagnosis present

## 2020-08-19 DIAGNOSIS — Z7189 Other specified counseling: Secondary | ICD-10-CM | POA: Diagnosis not present

## 2020-08-19 DIAGNOSIS — F3289 Other specified depressive episodes: Secondary | ICD-10-CM | POA: Diagnosis not present

## 2020-08-19 DIAGNOSIS — Z8249 Family history of ischemic heart disease and other diseases of the circulatory system: Secondary | ICD-10-CM

## 2020-08-19 DIAGNOSIS — J90 Pleural effusion, not elsewhere classified: Secondary | ICD-10-CM | POA: Diagnosis not present

## 2020-08-19 DIAGNOSIS — R404 Transient alteration of awareness: Secondary | ICD-10-CM | POA: Diagnosis not present

## 2020-08-19 DIAGNOSIS — H9190 Unspecified hearing loss, unspecified ear: Secondary | ICD-10-CM | POA: Diagnosis present

## 2020-08-19 DIAGNOSIS — R319 Hematuria, unspecified: Secondary | ICD-10-CM | POA: Diagnosis not present

## 2020-08-19 DIAGNOSIS — Z6830 Body mass index (BMI) 30.0-30.9, adult: Secondary | ICD-10-CM | POA: Diagnosis not present

## 2020-08-19 DIAGNOSIS — Z8551 Personal history of malignant neoplasm of bladder: Secondary | ICD-10-CM

## 2020-08-19 DIAGNOSIS — R451 Restlessness and agitation: Secondary | ICD-10-CM | POA: Diagnosis not present

## 2020-08-19 DIAGNOSIS — U071 COVID-19: Secondary | ICD-10-CM | POA: Diagnosis present

## 2020-08-19 DIAGNOSIS — R0789 Other chest pain: Secondary | ICD-10-CM | POA: Diagnosis not present

## 2020-08-19 DIAGNOSIS — Z7984 Long term (current) use of oral hypoglycemic drugs: Secondary | ICD-10-CM

## 2020-08-19 DIAGNOSIS — Z79899 Other long term (current) drug therapy: Secondary | ICD-10-CM | POA: Diagnosis not present

## 2020-08-19 DIAGNOSIS — F32A Depression, unspecified: Secondary | ICD-10-CM | POA: Diagnosis present

## 2020-08-19 DIAGNOSIS — E785 Hyperlipidemia, unspecified: Secondary | ICD-10-CM | POA: Diagnosis present

## 2020-08-19 DIAGNOSIS — W19XXXA Unspecified fall, initial encounter: Secondary | ICD-10-CM | POA: Diagnosis present

## 2020-08-19 DIAGNOSIS — R079 Chest pain, unspecified: Secondary | ICD-10-CM | POA: Diagnosis not present

## 2020-08-19 DIAGNOSIS — Z66 Do not resuscitate: Secondary | ICD-10-CM | POA: Diagnosis present

## 2020-08-19 DIAGNOSIS — A419 Sepsis, unspecified organism: Secondary | ICD-10-CM

## 2020-08-19 DIAGNOSIS — R310 Gross hematuria: Secondary | ICD-10-CM | POA: Diagnosis not present

## 2020-08-19 DIAGNOSIS — E1165 Type 2 diabetes mellitus with hyperglycemia: Secondary | ICD-10-CM | POA: Diagnosis not present

## 2020-08-19 DIAGNOSIS — R0902 Hypoxemia: Secondary | ICD-10-CM | POA: Diagnosis not present

## 2020-08-19 LAB — RESP PANEL BY RT-PCR (FLU A&B, COVID) ARPGX2
Influenza A by PCR: NEGATIVE
Influenza B by PCR: NEGATIVE
SARS Coronavirus 2 by RT PCR: POSITIVE — AB

## 2020-08-19 LAB — QUANTIFERON-TB GOLD PLUS
QuantiFERON Mitogen Value: >10 IU/mL
QuantiFERON Nil Value: 0.09 IU/mL
QuantiFERON TB1 Ag Value: 0.04 IU/mL
QuantiFERON TB2 Ag Value: 0.04 IU/mL
QuantiFERON-TB Gold Plus: NEGATIVE

## 2020-08-19 LAB — CBC
HCT: 41.2 % (ref 39.0–52.0)
Hemoglobin: 13.8 g/dL (ref 13.0–17.0)
MCH: 30.7 pg (ref 26.0–34.0)
MCHC: 33.5 g/dL (ref 30.0–36.0)
MCV: 91.6 fL (ref 80.0–100.0)
Platelets: 224 10*3/uL (ref 150–400)
RBC: 4.5 MIL/uL (ref 4.22–5.81)
RDW: 13.1 % (ref 11.5–15.5)
WBC: 18.6 10*3/uL — ABNORMAL HIGH (ref 4.0–10.5)
nRBC: 0 % (ref 0.0–0.2)

## 2020-08-19 LAB — BASIC METABOLIC PANEL
Anion gap: 10 (ref 5–15)
BUN: 44 mg/dL — ABNORMAL HIGH (ref 8–23)
CO2: 21 mmol/L — ABNORMAL LOW (ref 22–32)
Calcium: 7.7 mg/dL — ABNORMAL LOW (ref 8.9–10.3)
Chloride: 105 mmol/L (ref 98–111)
Creatinine, Ser: 1.75 mg/dL — ABNORMAL HIGH (ref 0.61–1.24)
GFR, Estimated: 37 mL/min — ABNORMAL LOW (ref >60)
Glucose, Bld: 258 mg/dL — ABNORMAL HIGH (ref 70–99)
Potassium: 4.2 mmol/L (ref 3.5–5.1)
Sodium: 136 mmol/L (ref 135–145)

## 2020-08-19 LAB — TROPONIN I (HIGH SENSITIVITY): Troponin I (High Sensitivity): 36 ng/L — ABNORMAL HIGH (ref <18)

## 2020-08-19 MED ORDER — ACETAMINOPHEN 325 MG PO TABS
650.0000 mg | ORAL_TABLET | Freq: Four times a day (QID) | ORAL | Status: DC | PRN
  Administered 2020-08-24: 19:00:00 650 mg via ORAL
  Filled 2020-08-19: qty 2

## 2020-08-19 MED ORDER — LORAZEPAM 0.5 MG PO TABS
0.5000 mg | ORAL_TABLET | ORAL | Status: DC | PRN
  Administered 2020-08-23 – 2020-08-26 (×8): 0.5 mg via SUBLINGUAL
  Filled 2020-08-19 (×8): qty 1

## 2020-08-19 MED ORDER — MORPHINE SULFATE (PF) 2 MG/ML IV SOLN
1.0000 mg | INTRAVENOUS | Status: DC | PRN
  Administered 2020-08-21: 11:00:00 1 mg via INTRAVENOUS

## 2020-08-19 MED ORDER — MAGNESIUM HYDROXIDE 400 MG/5ML PO SUSP
30.0000 mL | Freq: Every day | ORAL | Status: DC | PRN
  Filled 2020-08-19: qty 30

## 2020-08-19 MED ORDER — SERTRALINE HCL 50 MG PO TABS
50.0000 mg | ORAL_TABLET | Freq: Every day | ORAL | Status: DC
  Administered 2020-08-20 – 2020-08-24 (×3): 50 mg via ORAL
  Filled 2020-08-19 (×3): qty 1

## 2020-08-19 MED ORDER — TRAZODONE HCL 50 MG PO TABS
25.0000 mg | ORAL_TABLET | Freq: Every evening | ORAL | Status: DC | PRN
  Administered 2020-08-23: 25 mg via ORAL
  Filled 2020-08-19: qty 1

## 2020-08-19 MED ORDER — GLYBURIDE 5 MG PO TABS
5.0000 mg | ORAL_TABLET | Freq: Three times a day (TID) | ORAL | Status: DC
  Filled 2020-08-19: qty 1

## 2020-08-19 MED ORDER — ONDANSETRON HCL 4 MG/2ML IJ SOLN: 4.0000 mg | Freq: Four times a day (QID) | INTRAMUSCULAR | Status: DC | PRN

## 2020-08-19 MED ORDER — POLYETHYLENE GLYCOL 3350 17 G PO PACK
17.0000 g | PACK | Freq: Every day | ORAL | Status: DC
  Administered 2020-08-20 – 2020-08-23 (×2): 17 g via ORAL
  Filled 2020-08-19 (×3): qty 1

## 2020-08-19 MED ORDER — ONDANSETRON HCL 4 MG PO TABS: 4.0000 mg | ORAL_TABLET | Freq: Four times a day (QID) | ORAL | Status: DC | PRN

## 2020-08-19 MED ORDER — ACETAMINOPHEN 650 MG RE SUPP: 650.0000 mg | Freq: Four times a day (QID) | RECTAL | Status: DC | PRN

## 2020-08-19 NOTE — ED Notes (Signed)
Patient transported to X-ray 

## 2020-08-19 NOTE — ED Notes (Signed)
Dr. Mansy at bedside. 

## 2020-08-19 NOTE — Progress Notes (Addendum)
Pt was on comfort care and was admitted on the floor with no signs of distress. Pt alert to self and place. VSS. Pt was educated about safety and ascom within pt reach. Pt covid test came back positive. MD Mansy made aware. No new order was placed Screening assessment was not completed. Will notify incoming shift. Will continue to monitor.

## 2020-08-19 NOTE — ED Provider Notes (Signed)
[facility] Emergency Department Provider Note  ____________________________________________   I have reviewed the triage vital signs and the nursing notes.   HISTORY  Chief Complaint Chest Pain (Chest pain / fall )   History limited by: Hard of hearing, some history obtained from family at bedside   HPI Tareek Sabo is a 87 y.o. male who presents to the emergency department today from [facility] because of concern for fall. Patient went to [place] from home today because of increasing care need. Patient was seen in the emergency department 12 days ago and diagnosed with pneumonia. Family states that he finished his 7 day course of antibiotics but has gotten weaker and never fully recovered. They were unable to care for him at home so had him go to [facility] where he was found on the ground. Patient denies any new pain from the fall.   Records reviewed. Per medical record review patient has a history of recent ER visit for pneumonia.   Past Medical History:  Diagnosis Date   Back pain    Diabetes [facility])     Patient Active Problem List   Diagnosis Date Noted   Stage 3b chronic kidney disease ([place]) 12/14/2018   Hyperlipidemia associated with type 2 diabetes mellitus ([place]) 11/13/2017   Lumbar back pain 08/14/2017   Essential hypertension 08/14/2017   Type II diabetes mellitus with complication ([place]) 78/29/5621   History of bladder cancer 08/14/2017   Tobacco use disorder, moderate, in sustained remission, dependence 08/14/2017   History of prostate cancer 08/14/2017    Past Surgical History:  Procedure Laterality Date   BLADDER SURGERY  2007   CANCER REMOVAL    PROSTATE SURGERY  1997   CANCER REMOVAL    TONSILECTOMY, ADENOIDECTOMY, BILATERAL MYRINGOTOMY AND TUBES      Prior to Admission medications   Medication Sig Start Date End Date Taking? Authorizing Provider  acetaminophen (TYLENOL) 650 MG CR tablet Take 650 mg by mouth every 8 (eight)  hours as needed for pain.    [provider]  glyBURIDE (DIABETA) 5 MG tablet TAKE 1 TABLET BY MOUTH 3 TIMES DAILY 02/10/20   Glean Hess, MD  polyethylene glycol (MIRALAX / GLYCOLAX) 17 g packet Take 17 g by mouth daily. Give 1/2 dose daily.    [provider]  quinapril (ACCUPRIL) 40 MG tablet TAKE 1 TABLET BY MOUTH TWICE DAILY 02/10/20   Glean Hess, MD  sertraline (ZOLOFT) 50 MG tablet Take 50 mg by mouth daily. Take 1/2 tablet daily for 2 weeks then increase to 50 mg daily.    [provider]    Allergies Patient has no known allergies.  Family History  Problem Relation Age of Onset   Heart Problems Mother        71.75 yr old   Hypertension Father     Social History Social History   Tobacco Use   Smoking status: Former    Packs/day: 1.00    Years: 60.00    Pack years: 60.00    Types: Cigarettes    Quit date: 2011    Years since quitting: 11.4   Smokeless tobacco: Never  Vaping Use   Vaping Use: Never used  Substance Use Topics   Alcohol use: Never   Drug use: Never    Review of Systems Constitutional: No fever/chills Eyes: No visual changes. ENT: No sore throat. Cardiovascular: Denies chest pain. Respiratory: Denies shortness of breath. Gastrointestinal: No abdominal pain.  No nausea, no vomiting.  No diarrhea.   Genitourinary: Negative for dysuria. Musculoskeletal: Positive for leg pains.  Skin: Negative for rash. Neurological: Negative for headaches, focal weakness or numbness.  ____________________________________________   PHYSICAL EXAM:  VITAL SIGNS: ED Triage Vitals  Enc Vitals Group     BP 08/31/2020 2056 (!) 83/53     Pulse Rate 09/03/2020 2056 (!) 112     Resp 08/23/2020 2056 19     Temp 08/22/2020 2056 98.3 F (36.8 C)     Temp Source 08/21/2020 2056 Oral     SpO2 08/12/2020 2055 90 %     Weight 08/31/2020 2057 181 lb (82.1 kg)     Height 08/07/2020 2057 5\' 5"  (1.651 m)   Constitutional: Alert and oriented.  Eyes:  Conjunctivae are normal.  ENT      Head: Normocephalic and atraumatic.      Nose: No congestion/rhinnorhea.      Mouth/Throat: Mucous membranes are moist.      Neck: No stridor. Hematological/Lymphatic/Immunilogical: No cervical lymphadenopathy. Cardiovascular: Tachycardic,  regular rhythm.  No murmurs, rubs, or gallops.  Respiratory: Normal respiratory effort without tachypnea nor retractions. Breath sounds are clear and equal bilaterally. No wheezes/rales/rhonchi. Gastrointestinal: Soft and non tender. No rebound. No guarding.  Genitourinary: Deferred Musculoskeletal: Normal range of motion in all extremities. Slight bilateral lower extremity edema.  Neurologic:  Hard of hearing. Awake, alert and oriented. Moving all extremities. Sensation grossly intact.  Skin:  Skin is warm, dry and intact. No rash noted. Psychiatric: Mood and affect are normal. Speech and behavior are normal. Patient exhibits appropriate insight and judgment.  ____________________________________________    LABS (pertinent positives/negatives)  CBC wbc 18.6, hgb 13.8, plt 224 Trop hs 36 BMP na 136, k 4.2, glu 258, cr 1.75   ____________________________________________    RADIOLOGY  CXR Lower lobe disease  ____________________________________________   PROCEDURES  Procedures  ____________________________________________   INITIAL IMPRESSION / ASSESSMENT AND PLAN / ED COURSE  Pertinent labs & imaging results that were available during my care of the patient were reviewed by me and considered in my medical decision making (see chart for details).   Patient presented to the emergency department today because of concerns for a fall and weakness.  Work-up here is concerning for pneumonia and possible sepsis.  I did discuss this with patient and daughter.  Patient did come in with a MOST form stating that he would not want IV fluids but would use antibiotics.  In discussion with the daughter at this time  she does not think he would even want antibiotics.  She states that he is ready for the ending.  I did asked the patient and he does state that he would not want antibiotics at this time.  We did discuss that this could be a terminal illness without appropriate treatment.  At this time family and patient are comfortable with comfort care.  Will admit to hospitalist service for comfort care.  ___________________________________________   FINAL CLINICAL IMPRESSION(S) / ED DIAGNOSES  Final diagnoses:  Pneumonia due to infectious organism, unspecified laterality, unspecified part of lung     Note: This dictation was prepared with Dragon dictation. Any transcriptional errors that result from this process are unintentional     Nance Pear, MD 08/18/2020 2329

## 2020-08-19 NOTE — ED Triage Notes (Signed)
BIBA from [facility] for chest pain with a fall.  Patient recently discharged from this facility to [facility], as of today, per EMS.    DNR form with patient.  MOST form with patient.  Most indicates no fluids, but antibiotics are okay.  Comfort Measures only.    Patient denies pain at this time.  Indicates that he was dizzy when he fell.  Aox4 at this time.  Slow to respond.

## 2020-08-19 NOTE — H&P (Signed)
[place]   PATIENT NAME: Jesus Chavez    MR#:  [id number]  DATE OF BIRTH:  April 29, 1931  DATE OF ADMISSION:  08/24/2020  PRIMARY CARE PHYSICIAN: Glean Hess, MD   Patient is coming from: [facility] assisted living facility  REQUESTING/REFERRING PHYSICIAN: Nance Pear, MD  CHIEF COMPLAINT:   Chief Complaint  Patient presents with   Chest Pain    Chest pain / fall     HISTORY OF PRESENT ILLNESS:  Jesus Chavez is a 87 y.o. Caucasian   male with medical history significant for type 2 diabetes mellitus, essential hypertension, dyslipidemia, stage III chronic kidney disease, history of bladder and prostate cancer, and lumbar back pain, presented to the emergency room with acute onset of fall at [facility] where he went today at 2 PM from home.  The patient has been deteriorating progressively with inability to do his ADLs recently.  He was seen in the ER about 10 days ago and was found to have a small left lower lobe pneumonia for which she was given 1 week course of antibiotic therapy with Omnicef.  He has been fairly independent until 08/07/2020.  His daughter gives all the history.  When he fell today at his assisted living facility at around 8 PM, he was found on the floor as he was apparently trying to get to the bathroom.  His blood pressure was reportedly low and he was having chest pain.  During my interview he was very somnolent and would fall easily asleep when he is aroused.  No worsening cough or wheezing.  No reported fever or chills. ED Course: When he came to the ER, blood pressure was 83/53 with a heart rate of 112 and respiratory 19 temperature of 98.3 pulse oximetry was 92% on 2 L of O2 by nasal cannula.  BP later on was up to 94/61 with a heart rate of 110.  BP during my interview was up to systolic 009 on monitor.  Pulse oximetry was up to 95%.  Labs revealed a blood glucose of 258 and a BUN of 44 with creatinine 1.75.  CBC showed leukocytosis of 18.6.   Respiratory panel is currently pending.  Imaging: Two-view chest x-ray showed lower lung volumes with patchy left lower lobe atelectasis or infiltrate and small bilateral pleural effusions.  The patient has a DNR form as well as a MOST form indicating that he did not want any IV fluids.  He and his daughter also decided not to have IV antibiotic therapy and pursue comfort measures during this admission.  He will be admitted to the palliative care bed for comfort care. PAST MEDICAL HISTORY:   Past Medical History:  Diagnosis Date   Back pain    Diabetes ([place])   Type 2 diabetes mellitus, essential hypertension, dyslipidemia, stage III chronic kidney disease, history of bladder and prostate cancer, and lumbar back pain.  PAST SURGICAL HISTORY:   Past Surgical History:  Procedure Laterality Date   BLADDER SURGERY  2007   CANCER REMOVAL    PROSTATE SURGERY  1997   CANCER REMOVAL    TONSILECTOMY, ADENOIDECTOMY, BILATERAL MYRINGOTOMY AND TUBES      SOCIAL HISTORY:   Social History   Tobacco Use   Smoking status: Former    Packs/day: 1.00    Years: 60.00    Pack years: 60.00    Types: Cigarettes    Quit date: 2011    Years since quitting: 11.4   Smokeless tobacco:  Never  Substance Use Topics   Alcohol use: Never    FAMILY HISTORY:   Family History  Problem Relation Age of Onset   Heart Problems Mother        16.75 yr old   Hypertension Father     DRUG ALLERGIES:  No Known Allergies  REVIEW OF SYSTEMS:   ROS As per history of present illness. All pertinent systems were reviewed above. Constitutional, HEENT, cardiovascular, respiratory, GI, GU, musculoskeletal, neuro, psychiatric, endocrine, integumentary and hematologic systems were reviewed and are otherwise negative/unremarkable except for positive findings mentioned above in the HPI.   MEDICATIONS AT HOME:   Prior to Admission medications   Medication Sig Start Date End Date Taking? Authorizing Provider   acetaminophen (TYLENOL) 650 MG CR tablet Take 650 mg by mouth every 8 (eight) hours as needed for pain.    [provider]  glyBURIDE (DIABETA) 5 MG tablet TAKE 1 TABLET BY MOUTH 3 TIMES DAILY 02/10/20   Glean Hess, MD  polyethylene glycol (MIRALAX / GLYCOLAX) 17 g packet Take 17 g by mouth daily. Give 1/2 dose daily.    [provider]  quinapril (ACCUPRIL) 40 MG tablet TAKE 1 TABLET BY MOUTH TWICE DAILY 02/10/20   Glean Hess, MD  sertraline (ZOLOFT) 50 MG tablet Take 50 mg by mouth daily. Take 1/2 tablet daily for 2 weeks then increase to 50 mg daily.    [provider]      VITAL SIGNS:  Blood pressure 94/61, pulse (!) 110, temperature 98.3 F (36.8 C), temperature source Oral, resp. rate (!) 30, height 5\' 5"  (1.651 m), weight 82.1 kg, SpO2 95 %.  PHYSICAL EXAMINATION:  Physical Exam  GENERAL:  87 y.o.-year-old Caucasian male patient lying in the bed with no acute distress.  He was very somnolent but arousable. EYES: Pupils equal, round, reactive to light and accommodation. No scleral icterus. Extraocular muscles intact.  HEENT: Head atraumatic, normocephalic. Oropharynx and nasopharynx clear.  NECK:  Supple, no jugular venous distention. No thyroid enlargement, no tenderness.  LUNGS: Slight diminished bibasilar breath sounds with bibasal crackles.Marland Kitchen  CARDIOVASCULAR: Regular rate and rhythm, S1, S2 normal. No murmurs, rubs, or gallops.  ABDOMEN: Soft, nondistended, nontender. Bowel sounds present. No organomegaly or mass.  EXTREMITIES: 1-2+ bilateral lower extremity pitting edema with no cyanosis, or clubbing.  NEUROLOGIC: Cranial nerves II through XII are intact. Muscle strength 5/5 in all extremities. Sensation intact. Gait not checked.  PSYCHIATRIC: The patient is alert and oriented x 3.  Normal affect and good eye contact. SKIN: No obvious rash, lesion, or ulcer.   LABORATORY PANEL:   CBC Recent Labs  Lab 08/21/2020 2059  WBC 18.6*  HGB  13.8  HCT 41.2  PLT 224   ------------------------------------------------------------------------------------------------------------------  Chemistries  Recent Labs  Lab 08/16/2020 2059  NA 136  K 4.2  CL 105  CO2 21*  GLUCOSE 258*  BUN 44*  CREATININE 1.75*  CALCIUM 7.7*   ------------------------------------------------------------------------------------------------------------------  Cardiac Enzymes No results for input(s): TROPONINI in the last 168 hours. ------------------------------------------------------------------------------------------------------------------  RADIOLOGY:  DG Chest 2 View  Result Date: 08/13/2020 CLINICAL DATA:  Chest pain after falling. EXAM: CHEST - 2 VIEW COMPARISON:  Radiograph 08/07/2020 FINDINGS: The patient is rotated on the lateral view. The heart size and mediastinal contours are stable with aortic atherosclerosis. There are lower lung volumes with vascular congestion and new patchy airspace disease at the left lung base. There are probable small bilateral pleural effusions, best seen on the lateral  view. No evidence of pneumothorax or acute fracture. IMPRESSION: Lower lung volumes with patchy left lower lobe atelectasis or infiltrate. Small bilateral pleural effusions. Electronically Signed   By: Richardean Sale M.D.   On: 09/01/2020 21:21      IMPRESSION AND PLAN:  Active Problems:   Severe sepsis ([place])  1.  Severe sepsis likely secondary to pneumonia with associated hypotension and subsequent fall likely pleuritic chest pain. - The patient would be admitted to the palliative care bed. - We will utilize as needed IV morphine sulfate and sublingual Ativan for comfort measures. - O2 protocol will be followed. - The patient and his daughter who is a respiratory therapist, refused IV fluids and antibiotics and their wishes will be respected.  2.  Type 2 diabetes mellitus. - We will continue glimepiride and avoid fingerstick blood glucose  measures given comfort care.  3.  Essential hypertension. - We will hold off Accupril given his hypotension and acute kidney injury.  4.  Depression. - We will continue Zoloft for now.  DVT prophylaxis: None per patient's request.  Code Status: The patient is DNR/DNI.  He has an out of facility DNR form. Family Communication:  The plan of care was discussed in details with the patient (and his daughter who was with him in the room). I answered all questions. The patient agreed to proceed with the above mentioned plan. Further management will depend upon hospital course. Disposition Plan: Back to previous home environment Consults called: none. All the records are reviewed and case discussed with ED provider.  Status is: Inpatient  Remains inpatient appropriate because of the severity of his illness and palliative care and that would likely take greater than 2 midnights.  Dispo: The patient is from: ALF              Anticipated d/c is to: ALF              Patient currently is not medically stable to d/c.   Difficult to place patient No   TOTAL TIME TAKING CARE OF THIS PATIENT: 40 minutes.    Christel Mormon M.D on 08/06/2020 at 10:15 PM  [facility]   From 7 PM-7 AM, contact night-coverage [email or web address]  CC: Primary care physician; Glean Hess, MD

## 2020-08-20 DIAGNOSIS — U071 COVID-19: Secondary | ICD-10-CM

## 2020-08-20 DIAGNOSIS — F3289 Other specified depressive episodes: Secondary | ICD-10-CM

## 2020-08-20 DIAGNOSIS — Z7189 Other specified counseling: Secondary | ICD-10-CM

## 2020-08-20 DIAGNOSIS — J189 Pneumonia, unspecified organism: Secondary | ICD-10-CM

## 2020-08-20 DIAGNOSIS — F32A Depression, unspecified: Secondary | ICD-10-CM

## 2020-08-20 LAB — CBC
HCT: 41.6 % (ref 39.0–52.0)
Hemoglobin: 14 g/dL (ref 13.0–17.0)
MCH: 30.5 pg (ref 26.0–34.0)
MCHC: 33.7 g/dL (ref 30.0–36.0)
MCV: 90.6 fL (ref 80.0–100.0)
Platelets: 219 10*3/uL (ref 150–400)
RBC: 4.59 MIL/uL (ref 4.22–5.81)
RDW: 13 % (ref 11.5–15.5)
WBC: 16.6 10*3/uL — ABNORMAL HIGH (ref 4.0–10.5)
nRBC: 0 % (ref 0.0–0.2)

## 2020-08-20 LAB — BASIC METABOLIC PANEL
Anion gap: 6 (ref 5–15)
BUN: 40 mg/dL — ABNORMAL HIGH (ref 8–23)
CO2: 22 mmol/L (ref 22–32)
Calcium: 7.7 mg/dL — ABNORMAL LOW (ref 8.9–10.3)
Chloride: 110 mmol/L (ref 98–111)
Creatinine, Ser: 1.41 mg/dL — ABNORMAL HIGH (ref 0.61–1.24)
GFR, Estimated: 48 mL/min — ABNORMAL LOW (ref >60)
Glucose, Bld: 97 mg/dL (ref 70–99)
Potassium: 4.1 mmol/L (ref 3.5–5.1)
Sodium: 138 mmol/L (ref 135–145)

## 2020-08-20 LAB — GLUCOSE, CAPILLARY
Glucose-Capillary: 99 mg/dL (ref 70–99)
Glucose-Capillary: 210 mg/dL — ABNORMAL HIGH (ref 70–99)

## 2020-08-20 MED ORDER — INSULIN ASPART 100 UNIT/ML IJ SOLN
0.0000 [IU] | Freq: Three times a day (TID) | INTRAMUSCULAR | Status: DC
  Administered 2020-08-20: 2 [IU] via SUBCUTANEOUS
  Filled 2020-08-20: qty 1

## 2020-08-20 NOTE — Consult Note (Addendum)
Consultation Note Date: 08/20/2020   Patient Name: Jesus Chavez  DOB: [birth year]  MRN: [id number]  Age / Sex: 88 y.o., male  PCP: Glean Hess, MD Referring Physician: Wyvonnia Dusky, MD  Reason for Consultation: Establishing goals of care  HPI/Patient Profile: Jesus Chavez is a 87 y.o. Caucasian   male with medical history significant for type 2 diabetes mellitus, essential hypertension, dyslipidemia, stage III chronic kidney disease, history of bladder and prostate cancer, and lumbar back pain, presented to the emergency room with acute onset of fall at [facility] where he went today at 2 PM from home.   Clinical Assessment and Goals of Care: Patient is on covid precautions. Spoke to daughter- she confirms HPOA status. She is an RT. She states her father had been living alone. She states he has not been happy with his QOL in years and he has said many times, and she sees for herself "he is done". She states they have decided on comfort care.   She states he was fairly independent to do his ADL's prior to June 3rd. She states he has been followed by outpatient palliative and stopped most of his medications in March. She states on June 3rd, he was found on the floor, and was brought to the hospital and treated for PNA. She states things have been very different since then. He is very weak. He uses a walker but needs  assistance with mobility. He needs assistance with ADL's.  She states he was at ALF 1 day and fell. She states he would like full comfort care and she would as well. She confirms only comfort medications. Finger sticks and SSI in place. She states this was previously discussed on admission, and she would like to D/C this as he never checked his sugar at baseline, and had an A1C of 6-7. She states she would like to stop his oral diabetic agent as well given his limited prognosis and his  minimal PO intake.     SUMMARY OF RECOMMENDATIONS   Continue full comfort care and plans for hospice facility placement.     Prognosis:  < 2 weeks       Primary Diagnoses: Present on Admission:  Severe sepsis ([place])   I have reviewed the medical record, interviewed the patient and family, and examined the patient. The following aspects are pertinent.  Past Medical History:  Diagnosis Date   Back pain    Diabetes ([place])    Social History   Socioeconomic History   Marital status: Widowed    Spouse name: Not on file   Number of children: Not on file   Years of education: Not on file   Highest education level: Not on file  Occupational History   Occupation: RETIRED  Tobacco Use   Smoking status: Former    Packs/day: 1.00    Years: 60.00    Pack years: 60.00    Types: Cigarettes    Quit date: 2011    Years since quitting: 11.4   Smokeless  tobacco: Never  Vaping Use   Vaping Use: Never used  Substance and Sexual Activity   Alcohol use: Never   Drug use: Never   Sexual activity: Not on file  Other Topics Concern   Not on file  Social History Narrative   Not on file   Social Determinants of Health   Financial Resource Strain: Not on file  Food Insecurity: Not on file  Transportation Needs: Not on file  Physical Activity: Not on file  Stress: Not on file  Social Connections: Not on file   Family History  Problem Relation Age of Onset   Heart Problems Mother        11.75 yr old   Hypertension Father    Scheduled Meds:  insulin aspart  0-6 Units Subcutaneous TID WC   polyethylene glycol  17 g Oral Daily   sertraline  50 mg Oral Daily   Continuous Infusions: PRN Meds:.acetaminophen **OR** acetaminophen, LORazepam, magnesium hydroxide, morphine injection, ondansetron **OR** ondansetron (ZOFRAN) IV, traZODone Medications Prior to Admission:  Prior to Admission medications   Medication Sig Start Date End Date Taking? Authorizing Provider   acetaminophen (TYLENOL) 650 MG CR tablet Take 650 mg by mouth every 8 (eight) hours as needed for pain.    [provider]  glyBURIDE (DIABETA) 5 MG tablet TAKE 1 TABLET BY MOUTH 3 TIMES DAILY 02/10/20   Glean Hess, MD  polyethylene glycol (MIRALAX / GLYCOLAX) 17 g packet Take 17 g by mouth daily. Give 1/2 dose daily.    [provider]  quinapril (ACCUPRIL) 40 MG tablet TAKE 1 TABLET BY MOUTH TWICE DAILY 02/10/20   Glean Hess, MD  sertraline (ZOLOFT) 50 MG tablet Take 50 mg by mouth daily. Take 1/2 tablet daily for 2 weeks then increase to 50 mg daily.    [provider]   No Known Allergies   Vital Signs: BP 125/67 (BP Location: Left Arm)   Pulse 74   Temp 98 F (36.7 C) (Oral)   Resp 16   Ht 5\' 5"  (1.651 m)   Wt 82.1 kg   SpO2 98%   BMI 30.12 kg/m  Pain Scale: 0-10   Pain Score: Asleep   SpO2: SpO2: 98 % O2 Device:SpO2: 98 % O2 Flow Rate: .O2 Flow Rate (L/min): 2 L/min  IO: Intake/output summary:  Intake/Output Summary (Last 24 hours) at 08/20/2020 1305 Last data filed at 08/20/2020 1044 Gross per 24 hour  Intake 240 ml  Output --  Net 240 ml    LBM: Last BM Date:  (per pt cant remember) Baseline Weight: Weight: 82.1 kg Most recent weight: Weight: 82.1 kg        Time In: 1:00 Time Out: 1:30 Time Total: 30 min Greater than 50%  of this time was spent counseling and coordinating care related to the above assessment and plan.  COVID-19 DISASTER DECLARATION:    FULL CONTACT PHYSICAL EXAMINATION WAS NOT POSSIBLE DUE TO TREATMENT OF COVID-19  AND CONSERVATION OF PERSONAL PROTECTIVE EQUIPMENT   Patient assessed or the symptoms described in the history of present illness.  In the context of the Global COVID-19 pandemic, which necessitated consideration that the patient might be at risk for infection with the SARS-CoV-2 virus that causes COVID-19, Institutional protocols and algorithms that pertain to the evaluation of patients at  risk for COVID-19 are in a state of rapid change based on information released by regulatory bodies including the CDC and federal and state organizations. These policies and  algorithms were followed during the patient's care while in hospital.   Signed by: Asencion Gowda, NP   Please contact Palliative Medicine Team phone at [phone] for questions and concerns.  For individual provider: See Shea Evans

## 2020-08-20 NOTE — Plan of Care (Signed)
  Problem: Pain Managment: Goal: General experience of comfort will improve Outcome: Progressing   Problem: Safety: Goal: Ability to remain free from injury will improve Outcome: Progressing   

## 2020-08-20 NOTE — Progress Notes (Addendum)
PROGRESS NOTE    Jesus Chavez  NTZ:[id number] DOB: [birth year] DOA: 08/08/2020 PCP: Glean Hess, MD    Assessment & Plan:   Active Problems:   Severe sepsis (HCC)   Severe sepsis: met criteria w/ tachycardia, tachypnea, hypotension & COVID19 pneumonia. Pt refused IVFs & wants comfort care only   COVID19 pneumonia: continue w/ comfort care only   DM2: fairly well controlled w/ HbA1c 7.2 in 2021. Comfort care only   HTN: will hold home dose of quinapril as BP on low end of normal  Depression: severity unknown. Continue on home dose of zoloft    DVT prophylaxis: comfort care Code Status: DNR Family Communication: discussed pt's care w/ pt's daughter, [name], and answered her questions  Disposition Plan: hospice facility   Level of care: Palliative Care Consultants:  Palliative care  Procedures:   Antimicrobials:   Subjective: Pt c/o malaise  Objective: Vitals:   08/12/2020 2200 08/18/2020 2230 08/10/2020 2340 08/20/20 0441  BP: 94/61 112/62 122/67 124/77  Pulse: (!) 110 (!) 107 (!) 104 89  Resp: (!) 30 (!) $Remo'24 14 20  'wysyy$ Temp:   98 F (36.7 C) 97.9 F (36.6 C)  TempSrc:   Oral Oral  SpO2: 95% 94% 100% 92%  Weight:      Height:       No intake or output data in the 24 hours ending 08/20/20 0737 Filed Weights   08/31/2020 2057  Weight: 82.1 kg    Examination:  General exam: Appears calm and comfortable  Respiratory system: diminished breath sounds b/l Cardiovascular system: S1 & S2 +. No rubs, gallops or clicks.  Gastrointestinal system: Abdomen is nondistended, soft and nontender. Normal bowel sounds heard. Central nervous system: Alert and awake. Moves all extremities Psychiatry: Judgement and insight appear abnormal. Flat mood and affect    Data Reviewed: I have personally reviewed following labs and imaging studies  CBC: Recent Labs  Lab 08/26/2020 2059  WBC 18.6*  HGB 13.8  HCT 41.2  MCV 91.6  PLT 675   Basic Metabolic Panel: Recent Labs   Lab 08/11/2020 2059  NA 136  K 4.2  CL 105  CO2 21*  GLUCOSE 258*  BUN 44*  CREATININE 1.75*  CALCIUM 7.7*   GFR: Estimated Creatinine Clearance: 29.3 mL/min (A) (by C-G formula based on SCr of 1.75 mg/dL (H)). Liver Function Tests: No results for input(s): AST, ALT, ALKPHOS, BILITOT, PROT, ALBUMIN in the last 168 hours. No results for input(s): LIPASE, AMYLASE in the last 168 hours. No results for input(s): AMMONIA in the last 168 hours. Coagulation Profile: No results for input(s): INR, PROTIME in the last 168 hours. Cardiac Enzymes: No results for input(s): CKTOTAL, CKMB, CKMBINDEX, TROPONINI in the last 168 hours. BNP (last 3 results) No results for input(s): PROBNP in the last 8760 hours. HbA1C: No results for input(s): HGBA1C in the last 72 hours. CBG: No results for input(s): GLUCAP in the last 168 hours. Lipid Profile: No results for input(s): CHOL, HDL, LDLCALC, TRIG, CHOLHDL, LDLDIRECT in the last 72 hours. Thyroid Function Tests: No results for input(s): TSH, T4TOTAL, FREET4, T3FREE, THYROIDAB in the last 72 hours. Anemia Panel: No results for input(s): VITAMINB12, FOLATE, FERRITIN, TIBC, IRON, RETICCTPCT in the last 72 hours. Sepsis Labs: No results for input(s): PROCALCITON, LATICACIDVEN in the last 168 hours.  Recent Results (from the past 240 hour(s))  Resp Panel by RT-PCR (Flu A&B, Covid) Nasopharyngeal Swab     Status: Abnormal   Collection Time: 08/12/2020 10:11  PM   Specimen: Nasopharyngeal Swab; Nasopharyngeal(NP) swabs in vial transport medium  Result Value Ref Range Status   SARS Coronavirus 2 by RT PCR POSITIVE (A) NEGATIVE Final    Comment: RESULT CALLED TO, READ BACK BY AND VERIFIED WITH: MARICAR KIMREY@[email or web address]  08/26/2020 RH (NOTE) SARS-CoV-2 target nucleic acids are DETECTED.  The SARS-CoV-2 RNA is generally detectable in upper respiratory specimens during the acute phase of infection. Positive results are indicative of the presence of the identified  virus, but do not rule out bacterial infection or co-infection with other pathogens not detected by the test. Clinical correlation with patient history and other diagnostic information is necessary to determine patient infection status. The expected result is Negative.  Fact Sheet for Patients: EntrepreneurPulse.com.au  Fact Sheet for Healthcare Providers: IncredibleEmployment.be  This test is not yet approved or cleared by the [place] FDA and  has been authorized for detection and/or diagnosis of SARS-CoV-2 by FDA under an Emergency Use Authorization (EUA).  This EUA will remain in effect (meaning this test can be [place] ed) for the duration of  the COVID-19 declaration under Section 564(b)(1) of the Act, 21 U.S.C. section 360bbb-3(b)(1), unless the authorization is terminated or revoked sooner.     Influenza A by PCR NEGATIVE NEGATIVE Final   Influenza B by PCR NEGATIVE NEGATIVE Final    Comment: (NOTE) The Xpert Xpress SARS-CoV-2/FLU/RSV plus assay is intended as an aid in the diagnosis of influenza from Nasopharyngeal swab specimens and should not be used as a sole basis for treatment. Nasal washings and aspirates are unacceptable for Xpert Xpress SARS-CoV-2/FLU/RSV testing.  Fact Sheet for Patients: EntrepreneurPulse.com.au  Fact Sheet for Healthcare Providers: IncredibleEmployment.be  This test is not yet approved or cleared by the [place] FDA and has been authorized for detection and/or diagnosis of SARS-CoV-2 by FDA under an Emergency Use Authorization (EUA). This EUA will remain in effect (meaning this test can be used) for the duration of the COVID-19 declaration under Section 564(b)(1) of the Act, 21 U.S.C. section 360bbb-3(b)(1), unless the authorization is terminated or revoked.  Performed at [facility], [address], [place], [place] [address]           Radiology Studies: DG Chest 2 View  Result Date: 09/02/2020 CLINICAL DATA:  Chest pain after falling. EXAM: CHEST - 2 VIEW COMPARISON:  Radiograph 08/07/2020 FINDINGS: The patient is rotated on the lateral view. The heart size and mediastinal contours are stable with aortic atherosclerosis. There are lower lung volumes with vascular congestion and new patchy airspace disease at the left lung base. There are probable small bilateral pleural effusions, best seen on the lateral view. No evidence of pneumothorax or acute fracture. IMPRESSION: Lower lung volumes with patchy left lower lobe atelectasis or infiltrate. Small bilateral pleural effusions. Electronically Signed   By: Richardean Sale M.D.   On: 08/16/2020 21:21        Scheduled Meds:  glyBURIDE  5 mg Oral TID   polyethylene glycol  17 g Oral Daily   sertraline  50 mg Oral Daily   Continuous Infusions:   LOS: 1 day    Time spent: 30 mins    Wyvonnia Dusky, MD [facility] Pager [phone]  If 7PM-7AM, please contact night-coverage 08/20/2020, 7:37 AM

## 2020-08-20 NOTE — Progress Notes (Signed)
[facility])  Mr. Franta is our current palliative care patient in the community.  Discussions surrounding EOL care at [facility] however he is COVID +. We are unable to accept any COVID + pts at this time.  Thank you, Venia Carbon RN, BSN, [facility] Liaison

## 2020-08-21 NOTE — TOC Progression Note (Signed)
Transition of Care [facility]) - Progression Note    Patient Details  Name: Jesus Chavez MRN: [id number] Date of Birth: April 28, 1931  Transition of Care [facility]) CM/SW [place], RN Phone Number: 08/21/2020, 1:58 PM  Clinical Narrative:   TOC spoke to patient's daughter.  She stated that patient has severe sepsis and the family has made the decision not to intervene with care.  Daughter would like to send patient to [facility] in [place], however patient is COVID + at this time.  Patient cannot go to facility at this time, as there is a 10 day waiting period following the COVID diagnosis prior to going to the facility.    Palliative care spoke to family about [facility], but daughter states this is not an option.  Daughter is understanding that patient will be required to wait for 10 days.  [facility] aware of patient and family request.    TOC contact information given to daughter, TOC will follow to discharge.         Expected Discharge Plan and Services                                                 Social Determinants of Health (SDOH) Interventions    Readmission Risk Interventions No flowsheet data found.

## 2020-08-21 NOTE — Progress Notes (Addendum)
PROGRESS NOTE    Jesus Chavez  HMC:[id number] DOB: Feb 10, 1932 DOA: 08/10/2020 PCP: Glean Hess, MD    Assessment & Plan:   Active Problems:   Severe sepsis ([place])   COVID-19   Pneumonia due to infectious organism   Depression  Sepsis: met criteria w/ tachycardia, tachypnea, hypotension & COVID19 pneumonia. Severe sepsis r/o. Pt refused IVFs & wants comfort care only   COVID19 pneumonia: continue w/ comfort care only   DM2: fairly well controlled w/ HbA1c 7.2 in 2021. Continue w/ comfort care only   HTN: will hold home dose of quinapril as BP on low end of normal  Depression: severity unknown. Continue on home dose of zoloft   Possible CKDIIIa: unknown baseline Cr/GFR. Comfort care only   DVT prophylaxis: comfort care Code Status: DNR Family Communication: discussed pt's care w/ pt's daughter, [name], and answered her questions  Disposition Plan: hospice facility   Level of care: Palliative Care Consultants:  Palliative care  Procedures:   Antimicrobials:   Subjective: Pt c/o pain   Objective: Vitals:   08/20/20 0441 08/20/20 1200 08/20/20 1623 08/21/20 0537  BP: 124/77 125/67 (!) 159/98 129/73  Pulse: 89 74 78 79  Resp: 20 16 (!) 21 16  Temp: 97.9 F (36.6 C) 98 F (36.7 C) 98.1 F (36.7 C) 98.2 F (36.8 C)  TempSrc: Oral Oral Oral   SpO2: 92% 98% 96% 99%  Weight:      Height:        Intake/Output Summary (Last 24 hours) at 08/21/2020 0749 Last data filed at 08/21/2020 0214 Gross per 24 hour  Intake 240 ml  Output 850 ml  Net -610 ml   Filed Weights   08/06/2020 2057  Weight: 82.1 kg    Examination:  General exam: Appears uncomfortable  Respiratory system: decreased breath sounds b/l  Cardiovascular system: S1/S2+. No rubs or clicks   Gastrointestinal system: Abd is soft, NT, ND & normal bowel sounds  Central nervous system: Alert and awake. Moves all extremities  Psychiatry: Judgement and insight appear abnormal. Flat mood and  affect    Data Reviewed: I have personally reviewed following labs and imaging studies  CBC: Recent Labs  Lab 08/14/2020 2059 08/20/20 0831  WBC 18.6* 16.6*  HGB 13.8 14.0  HCT 41.2 41.6  MCV 91.6 90.6  PLT 224 662   Basic Metabolic Panel: Recent Labs  Lab 08/06/2020 2059 08/20/20 0831  NA 136 138  K 4.2 4.1  CL 105 110  CO2 21* 22  GLUCOSE 258* 97  BUN 44* 40*  CREATININE 1.75* 1.41*  CALCIUM 7.7* 7.7*   GFR: Estimated Creatinine Clearance: 36.4 mL/min (A) (by C-G formula based on SCr of 1.41 mg/dL (H)). Liver Function Tests: No results for input(s): AST, ALT, ALKPHOS, BILITOT, PROT, ALBUMIN in the last 168 hours. No results for input(s): LIPASE, AMYLASE in the last 168 hours. No results for input(s): AMMONIA in the last 168 hours. Coagulation Profile: No results for input(s): INR, PROTIME in the last 168 hours. Cardiac Enzymes: No results for input(s): CKTOTAL, CKMB, CKMBINDEX, TROPONINI in the last 168 hours. BNP (last 3 results) No results for input(s): PROBNP in the last 8760 hours. HbA1C: No results for input(s): HGBA1C in the last 72 hours. CBG: Recent Labs  Lab 08/20/20 0835 08/20/20 1159  GLUCAP 99 210*   Lipid Profile: No results for input(s): CHOL, HDL, LDLCALC, TRIG, CHOLHDL, LDLDIRECT in the last 72 hours. Thyroid Function Tests: No results for input(s): TSH, T4TOTAL,  FREET4, T3FREE, THYROIDAB in the last 72 hours. Anemia Panel: No results for input(s): VITAMINB12, FOLATE, FERRITIN, TIBC, IRON, RETICCTPCT in the last 72 hours. Sepsis Labs: No results for input(s): PROCALCITON, LATICACIDVEN in the last 168 hours.  Recent Results (from the past 240 hour(s))  Resp Panel by RT-PCR (Flu A&B, Covid) Nasopharyngeal Swab     Status: Abnormal   Collection Time: 08/29/2020 10:11 PM   Specimen: Nasopharyngeal Swab; Nasopharyngeal(NP) swabs in vial transport medium  Result Value Ref Range Status   SARS Coronavirus 2 by RT PCR POSITIVE (A) NEGATIVE Final     Comment: RESULT CALLED TO, READ BACK BY AND VERIFIED WITH: MARICAR KIMREY@[email or web address]  08/22/2020 RH (NOTE) SARS-CoV-2 target nucleic acids are DETECTED.  The SARS-CoV-2 RNA is generally detectable in upper respiratory specimens during the acute phase of infection. Positive results are indicative of the presence of the identified virus, but do not rule out bacterial infection or co-infection with other pathogens not detected by the test. Clinical correlation with patient history and other diagnostic information is necessary to determine patient infection status. The expected result is Negative.  Fact Sheet for Patients: EntrepreneurPulse.com.au  Fact Sheet for Healthcare Providers: IncredibleEmployment.be  This test is not yet approved or cleared by the [place] FDA and  has been authorized for detection and/or diagnosis of SARS-CoV-2 by FDA under an Emergency Use Authorization (EUA).  This EUA will remain in effect (meaning this test can be [place] ed) for the duration of  the COVID-19 declaration under Section 564(b)(1) of the Act, 21 U.S.C. section 360bbb-3(b)(1), unless the authorization is terminated or revoked sooner.     Influenza A by PCR NEGATIVE NEGATIVE Final   Influenza B by PCR NEGATIVE NEGATIVE Final    Comment: (NOTE) The Xpert Xpress SARS-CoV-2/FLU/RSV plus assay is intended as an aid in the diagnosis of influenza from Nasopharyngeal swab specimens and should not be used as a sole basis for treatment. Nasal washings and aspirates are unacceptable for Xpert Xpress SARS-CoV-2/FLU/RSV testing.  Fact Sheet for Patients: EntrepreneurPulse.com.au  Fact Sheet for Healthcare Providers: IncredibleEmployment.be  This test is not yet approved or cleared by the [place] FDA and has been authorized for detection and/or diagnosis of SARS-CoV-2 by FDA under an Emergency Use Authorization (EUA). This EUA  will remain in effect (meaning this test can be used) for the duration of the COVID-19 declaration under Section 564(b)(1) of the Act, 21 U.S.C. section 360bbb-3(b)(1), unless the authorization is terminated or revoked.  Performed at [facility], [address] [place], [place] [address]          Radiology Studies: DG Chest 2 View  Result Date: 08/11/2020 CLINICAL DATA:  Chest pain after falling. EXAM: CHEST - 2 VIEW COMPARISON:  Radiograph 08/07/2020 FINDINGS: The patient is rotated on the lateral view. The heart size and mediastinal contours are stable with aortic atherosclerosis. There are lower lung volumes with vascular congestion and new patchy airspace disease at the left lung base. There are probable small bilateral pleural effusions, best seen on the lateral view. No evidence of pneumothorax or acute fracture. IMPRESSION: Lower lung volumes with patchy left lower lobe atelectasis or infiltrate. Small bilateral pleural effusions. Electronically Signed   By: Richardean Sale M.D.   On: 08/08/2020 21:21        Scheduled Meds:  polyethylene glycol  17 g Oral Daily   sertraline  50 mg Oral Daily   Continuous Infusions:   LOS: 2 days    Time spent: 25  mins    Wyvonnia Dusky, MD [facility] Pager [phone]  If 7PM-7AM, please contact night-coverage 08/21/2020, 7:49 AM

## 2020-08-21 NOTE — Plan of Care (Signed)
Pt oriented to self and hospital, remains on 2L [place]. No endorsement or s/s pain overnight. External catheter in place with adequate UO, large BM overnight. No PRNs given during shift. Pt reported to be resting comfortably. Fall/safety precautions in place, rounding performed, needs/concerns addressed during shift.   Problem: Education: Goal: Knowledge of General Education information will improve Description: Including pain rating scale, medication(s)/side effects and non-pharmacologic comfort measures Outcome: Progressing   Problem: Health Behavior/Discharge Planning: Goal: Ability to manage health-related needs will improve Outcome: Progressing   Problem: Clinical Measurements: Goal: Ability to maintain clinical measurements within normal limits will improve Outcome: Progressing Goal: Will remain free from infection Outcome: Progressing Goal: Diagnostic test results will improve Outcome: Progressing Goal: Respiratory complications will improve Outcome: Progressing Goal: Cardiovascular complication will be avoided Outcome: Progressing   Problem: Activity: Goal: Risk for activity intolerance will decrease Outcome: Progressing   Problem: Nutrition: Goal: Adequate nutrition will be maintained Outcome: Progressing   Problem: Coping: Goal: Level of anxiety will decrease Outcome: Progressing   Problem: Elimination: Goal: Will not experience complications related to bowel motility Outcome: Progressing Goal: Will not experience complications related to urinary retention Outcome: Progressing   Problem: Pain Managment: Goal: General experience of comfort will improve Outcome: Progressing   Problem: Safety: Goal: Ability to remain free from injury will improve Outcome: Progressing   Problem: Skin Integrity: Goal: Risk for impaired skin integrity will decrease Outcome: Progressing

## 2020-08-21 NOTE — Care Management Important Message (Signed)
Important Message  Patient Details  Name: Jesus Chavez MRN: [id number] Date of Birth: 1931/07/12   Medicare Important Message Given:  Other (see comment)  Patient is on comfort care with discharge to the Hospice Home. Out of respect for the patient and family no Important Message from [facility] given.  [name] 08/21/2020, 8:54 AM

## 2020-08-22 NOTE — Progress Notes (Signed)
PROGRESS NOTE    Jesus Chavez  ZDG:[id number] DOB: 1931-03-20 DOA: 08/11/2020 PCP: Glean Hess, MD    Assessment & Plan:   Active Problems:   Severe sepsis ([place])   COVID-19   Pneumonia due to infectious organism   Depression  Sepsis: met criteria w/ tachycardia, tachypnea, hypotension & COVID19 pneumonia. Severe sepsis r/o. Pt refused IVFs & wants comfort care only   COVID19 pneumonia: continue w/ comfort care only   DM2: fairly well controlled w/ HbA1c 7.2 in 2021. Continue w/ comfort care only   HTN: continue w/ comfort care only   Depression: severity unknown. Continue on home dose of zoloft   Possible CKDIIIa: unknown baseline Cr/GFR. Comfort care only   DVT prophylaxis: comfort care Code Status: DNR Family Communication:  Disposition Plan: hospice facility   Level of care: Palliative Care  Status is: Inpatient  Remains inpatient appropriate because:Unsafe d/c plan  Dispo: The patient is from: Home              Anticipated d/c is to:  hospice facility , waiting on a hospice bed              Patient currently is medically stable to d/c. to go to a hospice facility    Difficult to place patient: unclear        Consultants:  Palliative care  Procedures:   Antimicrobials:   Subjective: Pt c/o fatigue   Objective: Vitals:   08/20/20 1623 08/21/20 0537 08/21/20 0854 08/22/20 0531  BP: (!) 159/98 129/73 (!) 191/82 (!) 176/85  Pulse: 78 79 88 75  Resp: (!) _0 Temp: 98.1 F (36.7 C) 98.2 F (36.8 C) (!) 97.4 F (36.3 C) (!) 97.3 F (36.3 C)  TempSrc: Oral  Oral Oral  SpO2: 96% 99% 100% 99%  Weight:      Height:        Intake/Output Summary (Last 24 hours) at 08/22/2020 0737 Last data filed at 08/22/2020 0331 Gross per 24 hour  Intake 0 ml  Output 980 ml  Net -980 ml   Filed Weights   08/25/2020 2057  Weight: 82.1 kg    Examination:  General exam: Appears comfortable  Respiratory system: diminished breath sounds  b/l Cardiovascular system: S1 & S2+. No gallops or rubs Gastrointestinal system: Abd is soft, NT, ND & normal bowel sounds  Central nervous system: Alert and awake. Moves all extremities Psychiatry: judgement and insight appear abnormal. Flat mood and affect    Data Reviewed: I have personally reviewed following labs and imaging studies  CBC: Recent Labs  Lab 08/29/2020 2059 08/20/20 0831  WBC 18.6* 16.6*  HGB 13.8 14.0  HCT 41.2 41.6  MCV 91.6 90.6  PLT 224 951   Basic Metabolic Panel: Recent Labs  Lab 08/06/2020 2059 08/20/20 0831  NA 136 138  K 4.2 4.1  CL 105 110  CO2 21* 22  GLUCOSE 258* 97  BUN 44* 40*  CREATININE 1.75* 1.41*  CALCIUM 7.7* 7.7*   GFR: Estimated Creatinine Clearance: 36.4 mL/min (A) (by C-G formula based on SCr of 1.41 mg/dL (H)). Liver Function Tests: No results for input(s): AST, ALT, ALKPHOS, BILITOT, PROT, ALBUMIN in the last 168 hours. No results for input(s): LIPASE, AMYLASE in the last 168 hours. No results for input(s): AMMONIA in the last 168 hours. Coagulation Profile: No results for input(s): INR, PROTIME in the last 168 hours. Cardiac Enzymes: No results for input(s): CKTOTAL, CKMB, CKMBINDEX, TROPONINI in the last  168 hours. BNP (last 3 results) No results for input(s): PROBNP in the last 8760 hours. HbA1C: No results for input(s): HGBA1C in the last 72 hours. CBG: Recent Labs  Lab 08/20/20 0835 08/20/20 1159  GLUCAP 99 210*   Lipid Profile: No results for input(s): CHOL, HDL, LDLCALC, TRIG, CHOLHDL, LDLDIRECT in the last 72 hours. Thyroid Function Tests: No results for input(s): TSH, T4TOTAL, FREET4, T3FREE, THYROIDAB in the last 72 hours. Anemia Panel: No results for input(s): VITAMINB12, FOLATE, FERRITIN, TIBC, IRON, RETICCTPCT in the last 72 hours. Sepsis Labs: No results for input(s): PROCALCITON, LATICACIDVEN in the last 168 hours.  Recent Results (from the past 240 hour(s))  Resp Panel by RT-PCR (Flu A&B, Covid)  Nasopharyngeal Swab     Status: Abnormal   Collection Time: 08/16/2020 10:11 PM   Specimen: Nasopharyngeal Swab; Nasopharyngeal(NP) swabs in vial transport medium  Result Value Ref Range Status   SARS Coronavirus 2 by RT PCR POSITIVE (A) NEGATIVE Final    Comment: RESULT CALLED TO, READ BACK BY AND VERIFIED WITH: MARICAR [email or web address]_0  08/14/2020 RH (NOTE) SARS-CoV-2 target nucleic acids are DETECTED.  The SARS-CoV-2 RNA is generally detectable in upper respiratory specimens during the acute phase of infection. Positive results are indicative of the presence of the identified virus, but do not rule out bacterial infection or co-infection with other pathogens not detected by the test. Clinical correlation with patient history and other diagnostic information is necessary to determine patient infection status. The expected result is Negative.  Fact Sheet for Patients: EntrepreneurPulse.com.au  Fact Sheet for Healthcare Providers: IncredibleEmployment.be  This test is not yet approved or cleared by the [place] FDA and  has been authorized for detection and/or diagnosis of SARS-CoV-2 by FDA under an Emergency Use Authorization (EUA).  This EUA will remain in effect (meaning this test can be [place] ed) for the duration of  the COVID-19 declaration under Section 564(b)(1) of the Act, 21 U.S.C. section 360bbb-3(b)(1), unless the authorization is terminated or revoked sooner.     Influenza A by PCR NEGATIVE NEGATIVE Final   Influenza B by PCR NEGATIVE NEGATIVE Final    Comment: (NOTE) The Xpert Xpress SARS-CoV-2/FLU/RSV plus assay is intended as an aid in the diagnosis of influenza from Nasopharyngeal swab specimens and should not be used as a sole basis for treatment. Nasal washings and aspirates are unacceptable for Xpert Xpress SARS-CoV-2/FLU/RSV testing.  Fact Sheet for Patients: EntrepreneurPulse.com.au  Fact Sheet for Healthcare  Providers: IncredibleEmployment.be  This test is not yet approved or cleared by the [place] FDA and has been authorized for detection and/or diagnosis of SARS-CoV-2 by FDA under an Emergency Use Authorization (EUA). This EUA will remain in effect (meaning this test can be used) for the duration of the COVID-19 declaration under Section 564(b)(1) of the Act, 21 U.S.C. section 360bbb-3(b)(1), unless the authorization is terminated or revoked.  Performed at [facility], [address], [place], [place] [address]          Radiology Studies: No results found.      Scheduled Meds:  polyethylene glycol  17 g Oral Daily   sertraline  50 mg Oral Daily   Continuous Infusions:   LOS: 3 days    Time spent: 15 mins    Wyvonnia Dusky, MD [facility] Pager [phone]  If 7PM-7AM, please contact night-coverage 08/22/2020, 7:37 AM

## 2020-08-22 NOTE — Plan of Care (Signed)
Pt oriented to self, remains on 2L [place], no endorsement or s/s pain overnight. Foley in place with adequate UO, no BM this shift. Q2hr turns maintained, fall/safety precautions in place, rounding performed, needs/concerns addressed overnight.   Problem: Education: Goal: Knowledge of General Education information will improve Description: Including pain rating scale, medication(s)/side effects and non-pharmacologic comfort measures Outcome: Progressing   Problem: Health Behavior/Discharge Planning: Goal: Ability to manage health-related needs will improve Outcome: Progressing   Problem: Clinical Measurements: Goal: Ability to maintain clinical measurements within normal limits will improve Outcome: Progressing Goal: Will remain free from infection Outcome: Progressing Goal: Diagnostic test results will improve Outcome: Progressing Goal: Respiratory complications will improve Outcome: Progressing Goal: Cardiovascular complication will be avoided Outcome: Progressing   Problem: Activity: Goal: Risk for activity intolerance will decrease Outcome: Progressing   Problem: Nutrition: Goal: Adequate nutrition will be maintained Outcome: Progressing   Problem: Coping: Goal: Level of anxiety will decrease Outcome: Progressing   Problem: Elimination: Goal: Will not experience complications related to bowel motility Outcome: Progressing Goal: Will not experience complications related to urinary retention Outcome: Progressing   Problem: Pain Managment: Goal: General experience of comfort will improve Outcome: Progressing   Problem: Safety: Goal: Ability to remain free from injury will improve Outcome: Progressing   Problem: Skin Integrity: Goal: Risk for impaired skin integrity will decrease Outcome: Progressing

## 2020-08-23 NOTE — Progress Notes (Signed)
PROGRESS NOTE    Jesus Chavez  VQM:[id number] DOB: [birth year] DOA: 08/29/2020 PCP: Glean Hess, MD    Assessment & Plan:   Active Problems:   Severe sepsis ([place])   COVID-19   Pneumonia due to infectious organism   Depression  Sepsis: met criteria w/ tachycardia, tachypnea, hypotension & COVID19 pneumonia. Severe sepsis r/o. Pt refused IVFs & wants comfort care only   COVID19 pneumonia: continue w/ comfort care   DM2: fairly well controlled w/ HbA1c 7.2 in 2021. Continue w/ comfort care only   HTN: continue w/ comfort care only   Depression: severity unknown. Continue on home dose of zoloft   Possible CKDIIIa: unknown baseline Cr/GFR. Comfort care only   DVT prophylaxis: comfort care Code Status: DNR Family Communication: discussed pt's care w/ pt's daughter, Opal Sidles, and answer her questions Disposition Plan: hospice facility   Level of care: Palliative Care  Status is: Inpatient  Remains inpatient appropriate because:Unsafe d/c plan  Dispo: The patient is from: Home              Anticipated d/c is to:  hospice facility , waiting on a hospice bed              Patient currently is medically stable to d/c. to go to a hospice facility    Difficult to place patient: unclear        Consultants:  Palliative care  Procedures:   Antimicrobials:   Subjective: Pt denies any complaints   Objective: Vitals:   08/21/20 0854 08/22/20 0531 08/22/20 1632 08/23/20 0411  BP: (!) 191/82 (!) 176/85 (!) 140/121 (!) 169/82  Pulse: 88 75 86 77  Resp: _0 Temp: (!) 97.4 F (36.3 C) (!) 97.3 F (36.3 C) 98.2 F (36.8 C) 97.8 F (36.6 C)  TempSrc: Oral Oral  Oral  SpO2: 100% 99% 93% 97%  Weight:      Height:        Intake/Output Summary (Last 24 hours) at 08/23/2020 0741 Last data filed at 08/23/2020 0411 Gross per 24 hour  Intake 630 ml  Output 1100 ml  Net -470 ml   Filed Weights   08/24/2020 2057  Weight: 82.1 kg    Examination:  General  exam: Appears comfortable  Respiratory system: decreased breath sounds b/l  Cardiovascular system: S1/S2+. No rubs or clicks  Gastrointestinal system: Abd is soft, NT, ND & hypoactive bowel sounds  Central nervous system: Alert and awake. Moves all extremities  Psychiatry: judgement and insight appears abnormal. Flat mood and affect    Data Reviewed: I have personally reviewed following labs and imaging studies  CBC: Recent Labs  Lab 08/16/2020 2059 08/20/20 0831  WBC 18.6* 16.6*  HGB 13.8 14.0  HCT 41.2 41.6  MCV 91.6 90.6  PLT 224 932   Basic Metabolic Panel: Recent Labs  Lab 08/22/2020 2059 08/20/20 0831  NA 136 138  K 4.2 4.1  CL 105 110  CO2 21* 22  GLUCOSE 258* 97  BUN 44* 40*  CREATININE 1.75* 1.41*  CALCIUM 7.7* 7.7*   GFR: Estimated Creatinine Clearance: 36.4 mL/min (A) (by C-G formula based on SCr of 1.41 mg/dL (H)). Liver Function Tests: No results for input(s): AST, ALT, ALKPHOS, BILITOT, PROT, ALBUMIN in the last 168 hours. No results for input(s): LIPASE, AMYLASE in the last 168 hours. No results for input(s): AMMONIA in the last 168 hours. Coagulation Profile: No results for input(s): INR, PROTIME in the last 168 hours. Cardiac Enzymes:  No results for input(s): CKTOTAL, CKMB, CKMBINDEX, TROPONINI in the last 168 hours. BNP (last 3 results) No results for input(s): PROBNP in the last 8760 hours. HbA1C: No results for input(s): HGBA1C in the last 72 hours. CBG: Recent Labs  Lab 08/20/20 0835 08/20/20 1159  GLUCAP 99 210*   Lipid Profile: No results for input(s): CHOL, HDL, LDLCALC, TRIG, CHOLHDL, LDLDIRECT in the last 72 hours. Thyroid Function Tests: No results for input(s): TSH, T4TOTAL, FREET4, T3FREE, THYROIDAB in the last 72 hours. Anemia Panel: No results for input(s): VITAMINB12, FOLATE, FERRITIN, TIBC, IRON, RETICCTPCT in the last 72 hours. Sepsis Labs: No results for input(s): PROCALCITON, LATICACIDVEN in the last 168 hours.  Recent  Results (from the past 240 hour(s))  Resp Panel by RT-PCR (Flu A&B, Covid) Nasopharyngeal Swab     Status: Abnormal   Collection Time: 08/23/2020 10:11 PM   Specimen: Nasopharyngeal Swab; Nasopharyngeal(NP) swabs in vial transport medium  Result Value Ref Range Status   SARS Coronavirus 2 by RT PCR POSITIVE (A) NEGATIVE Final    Comment: RESULT CALLED TO, READ BACK BY AND VERIFIED WITH: MARICAR [email or web address]  08/23/2020 RH (NOTE) SARS-CoV-2 target nucleic acids are DETECTED.  The SARS-CoV-2 RNA is generally detectable in upper respiratory specimens during the acute phase of infection. Positive results are indicative of the presence of the identified virus, but do not rule out bacterial infection or co-infection with other pathogens not detected by the test. Clinical correlation with patient history and other diagnostic information is necessary to determine patient infection status. The expected result is Negative.  Fact Sheet for Patients: EntrepreneurPulse.com.au  Fact Sheet for Healthcare Providers: IncredibleEmployment.be  This test is not yet approved or cleared by the [place] FDA and  has been authorized for detection and/or diagnosis of SARS-CoV-2 by FDA under an Emergency Use Authorization (EUA).  This EUA will remain in effect (meaning this test can be [place] ed) for the duration of  the COVID-19 declaration under Section 564(b)(1) of the Act, 21 U.S.C. section 360bbb-3(b)(1), unless the authorization is terminated or revoked sooner.     Influenza A by PCR NEGATIVE NEGATIVE Final   Influenza B by PCR NEGATIVE NEGATIVE Final    Comment: (NOTE) The Xpert Xpress SARS-CoV-2/FLU/RSV plus assay is intended as an aid in the diagnosis of influenza from Nasopharyngeal swab specimens and should not be used as a sole basis for treatment. Nasal washings and aspirates are unacceptable for Xpert Xpress SARS-CoV-2/FLU/RSV testing.  Fact Sheet for  Patients: EntrepreneurPulse.com.au  Fact Sheet for Healthcare Providers: IncredibleEmployment.be  This test is not yet approved or cleared by the [place] FDA and has been authorized for detection and/or diagnosis of SARS-CoV-2 by FDA under an Emergency Use Authorization (EUA). This EUA will remain in effect (meaning this test can be used) for the duration of the COVID-19 declaration under Section 564(b)(1) of the Act, 21 U.S.C. section 360bbb-3(b)(1), unless the authorization is terminated or revoked.  Performed at [facility], [address], [place], [place] [address]          Radiology Studies: No results found.      Scheduled Meds:  polyethylene glycol  17 g Oral Daily   sertraline  50 mg Oral Daily   Continuous Infusions:   LOS: 4 days    Time spent: 15 mins    Wyvonnia Dusky, MD [facility] Pager [phone]  If 7PM-7AM, please contact night-coverage 08/23/2020, 7:41 AM

## 2020-08-23 NOTE — Plan of Care (Signed)
Pt oriented to self, occasionally hospital. No c/o or s/s pain overnight. Foley in place for comfort. Q2hr turns maintained, fall/safety precautions in place.   Problem: Education: Goal: Knowledge of General Education information will improve Description: Including pain rating scale, medication(s)/side effects and non-pharmacologic comfort measures Outcome: Progressing   Problem: Health Behavior/Discharge Planning: Goal: Ability to manage health-related needs will improve Outcome: Progressing   Problem: Clinical Measurements: Goal: Ability to maintain clinical measurements within normal limits will improve Outcome: Progressing Goal: Will remain free from infection Outcome: Progressing Goal: Diagnostic test results will improve Outcome: Progressing Goal: Respiratory complications will improve Outcome: Progressing Goal: Cardiovascular complication will be avoided Outcome: Progressing   Problem: Activity: Goal: Risk for activity intolerance will decrease Outcome: Progressing   Problem: Nutrition: Goal: Adequate nutrition will be maintained Outcome: Progressing   Problem: Coping: Goal: Level of anxiety will decrease Outcome: Progressing   Problem: Elimination: Goal: Will not experience complications related to bowel motility Outcome: Progressing Goal: Will not experience complications related to urinary retention Outcome: Progressing   Problem: Pain Managment: Goal: General experience of comfort will improve Outcome: Progressing   Problem: Safety: Goal: Ability to remain free from injury will improve Outcome: Progressing   Problem: Skin Integrity: Goal: Risk for impaired skin integrity will decrease Outcome: Progressing

## 2020-08-24 MED ORDER — MORPHINE SULFATE (CONCENTRATE) 10 MG/0.5ML PO SOLN
5.0000 mg | ORAL | Status: DC | PRN
  Administered 2020-08-24 – 2020-08-26 (×5): 5 mg via SUBLINGUAL
  Filled 2020-08-24 (×5): qty 0.5

## 2020-08-24 NOTE — TOC Progression Note (Signed)
Transition of Care [facility]) - Progression Note    Patient Details  Name: Jesus Chavez MRN: [id number] Date of Birth: [birth year]  [facility] CM/SW [place], RN Phone Number: 08/24/2020, 1:33 PM  Clinical Narrative:   Patient continues to wait for COVID clearance and wait for Hospice House evaluation.  Patient has been here 5 days, the requirement for COVID clearance is 10 days.  TOC to follow to discharge.         Expected Discharge Plan and Services                                                 Social Determinants of Health (SDOH) Interventions    Readmission Risk Interventions No flowsheet data found.

## 2020-08-24 NOTE — Progress Notes (Signed)
PROGRESS NOTE    Jesus Chavez  MBW:[id number] DOB: 1931-12-27 DOA: 08/12/2020 PCP: Glean Hess, MD    Assessment & Plan:   Active Problems:   Severe sepsis ([place])   COVID-19   Pneumonia due to infectious organism   Depression  Failure to thrive: secondary to all below. Continue w/ morphine, ativan prn   Sepsis: met criteria w/ tachycardia, tachypnea, hypotension & COVID19 pneumonia. Severe sepsis r/o. Pt refused IVFs & wants comfort care only   COVID19 pneumonia: continue w/ comfort care   DM2: fairly well controlled w/ HbA1c 7.2 in 2021. Continue w/ comfort care only   HTN: continue w/ comfort care only   Depression: severity unknown. Continue w/ home dose of zoloft   Possible CKDIIIa: unknown baseline Cr/GFR. Continue w/ comfort care only   DVT prophylaxis: comfort care Code Status: DNR Family Communication: discussed pt's care w/ pt's daughter, Opal Sidles, and answer her questions Disposition Plan: hospice facility   Level of care: Palliative Care  Status is: Inpatient  Remains inpatient appropriate because:Unsafe d/c plan  Dispo: The patient is from: Home              Anticipated d/c is to:  hospice facility , waiting on a hospice bed              Patient currently is medically stable to d/c. to go to a hospice facility    Difficult to place patient: unclear        Consultants:  Palliative care  Procedures:   Antimicrobials:   Subjective: Pt was more confused today. Pt did not c/o anything this morning  Objective: Vitals:   08/22/20 0531 08/22/20 1632 08/23/20 0411 08/24/20 0445  BP: (!) 176/85 (!) 140/121 (!) 169/82 (!) 133/91  Pulse: 75 86 77 79  Resp: _0 Temp: (!) 97.3 F (36.3 C) 98.2 F (36.8 C) 97.8 F (36.6 C)   TempSrc: Oral  Oral   SpO2: 99% 93% 97% 98%  Weight:      Height:        Intake/Output Summary (Last 24 hours) at 08/24/2020 0737 Last data filed at 08/24/2020 0456 Gross per 24 hour  Intake 118 ml  Output  1350 ml  Net -1232 ml   Filed Weights   08/13/2020 2057  Weight: 82.1 kg    Examination:  General exam: Appears uncomfortable  Respiratory system: diminished breath sounds b/l  Cardiovascular system: S1 & S2+. No rubs or clicks Gastrointestinal system: Abd is soft, NT, ND & normal bowel sounds  Central nervous system: Alert and awake. Moves all extremities  Psychiatry: judgement and insight appear abnormal. Flat mood and affect    Data Reviewed: I have personally reviewed following labs and imaging studies  CBC: Recent Labs  Lab 08/13/2020 2059 08/20/20 0831  WBC 18.6* 16.6*  HGB 13.8 14.0  HCT 41.2 41.6  MCV 91.6 90.6  PLT 224 017   Basic Metabolic Panel: Recent Labs  Lab 08/10/2020 2059 08/20/20 0831  NA 136 138  K 4.2 4.1  CL 105 110  CO2 21* 22  GLUCOSE 258* 97  BUN 44* 40*  CREATININE 1.75* 1.41*  CALCIUM 7.7* 7.7*   GFR: Estimated Creatinine Clearance: 36.4 mL/min (A) (by C-G formula based on SCr of 1.41 mg/dL (H)). Liver Function Tests: No results for input(s): AST, ALT, ALKPHOS, BILITOT, PROT, ALBUMIN in the last 168 hours. No results for input(s): LIPASE, AMYLASE in the last 168 hours. No results for input(s): AMMONIA  in the last 168 hours. Coagulation Profile: No results for input(s): INR, PROTIME in the last 168 hours. Cardiac Enzymes: No results for input(s): CKTOTAL, CKMB, CKMBINDEX, TROPONINI in the last 168 hours. BNP (last 3 results) No results for input(s): PROBNP in the last 8760 hours. HbA1C: No results for input(s): HGBA1C in the last 72 hours. CBG: Recent Labs  Lab 08/20/20 0835 08/20/20 1159  GLUCAP 99 210*   Lipid Profile: No results for input(s): CHOL, HDL, LDLCALC, TRIG, CHOLHDL, LDLDIRECT in the last 72 hours. Thyroid Function Tests: No results for input(s): TSH, T4TOTAL, FREET4, T3FREE, THYROIDAB in the last 72 hours. Anemia Panel: No results for input(s): VITAMINB12, FOLATE, FERRITIN, TIBC, IRON, RETICCTPCT in the last 72  hours. Sepsis Labs: No results for input(s): PROCALCITON, LATICACIDVEN in the last 168 hours.  Recent Results (from the past 240 hour(s))  Resp Panel by RT-PCR (Flu A&B, Covid) Nasopharyngeal Swab     Status: Abnormal   Collection Time: 08/05/2020 10:11 PM   Specimen: Nasopharyngeal Swab; Nasopharyngeal(NP) swabs in vial transport medium  Result Value Ref Range Status   SARS Coronavirus 2 by RT PCR POSITIVE (A) NEGATIVE Final    Comment: RESULT CALLED TO, READ BACK BY AND VERIFIED WITH: MARICAR [email or web address]  08/23/2020 RH (NOTE) SARS-CoV-2 target nucleic acids are DETECTED.  The SARS-CoV-2 RNA is generally detectable in upper respiratory specimens during the acute phase of infection. Positive results are indicative of the presence of the identified virus, but do not rule out bacterial infection or co-infection with other pathogens not detected by the test. Clinical correlation with patient history and other diagnostic information is necessary to determine patient infection status. The expected result is Negative.  Fact Sheet for Patients: EntrepreneurPulse.com.au  Fact Sheet for Healthcare Providers: IncredibleEmployment.be  This test is not yet approved or cleared by the [place] FDA and  has been authorized for detection and/or diagnosis of SARS-CoV-2 by FDA under an Emergency Use Authorization (EUA).  This EUA will remain in effect (meaning this test can be [place] ed) for the duration of  the COVID-19 declaration under Section 564(b)(1) of the Act, 21 U.S.C. section 360bbb-3(b)(1), unless the authorization is terminated or revoked sooner.     Influenza A by PCR NEGATIVE NEGATIVE Final   Influenza B by PCR NEGATIVE NEGATIVE Final    Comment: (NOTE) The Xpert Xpress SARS-CoV-2/FLU/RSV plus assay is intended as an aid in the diagnosis of influenza from Nasopharyngeal swab specimens and should not be used as a sole basis for treatment. Nasal  washings and aspirates are unacceptable for Xpert Xpress SARS-CoV-2/FLU/RSV testing.  Fact Sheet for Patients: EntrepreneurPulse.com.au  Fact Sheet for Healthcare Providers: IncredibleEmployment.be  This test is not yet approved or cleared by the [place] FDA and has been authorized for detection and/or diagnosis of SARS-CoV-2 by FDA under an Emergency Use Authorization (EUA). This EUA will remain in effect (meaning this test can be used) for the duration of the COVID-19 declaration under Section 564(b)(1) of the Act, 21 U.S.C. section 360bbb-3(b)(1), unless the authorization is terminated or revoked.  Performed at [facility], [address], [place], [place] [address]          Radiology Studies: No results found.      Scheduled Meds:  polyethylene glycol  17 g Oral Daily   sertraline  50 mg Oral Daily   Continuous Infusions:   LOS: 5 days    Time spent: 15 mins    Wyvonnia Dusky, MD [facility] Pager [phone]  [phone]  If 7PM-7AM, please contact night-coverage 08/24/2020, 7:37 AM

## 2020-08-24 NOTE — Plan of Care (Signed)
Pt oriented to self. More restless/anxious overnight compared to previous nights. Utilized PRN lorazepam and trazodone to aid with anxiety and sleep. Foley remains in place, x2 BM overnight. Fall/safety precautions in place, rounding performed, needs/concerns addressed.  Problem: Education: Goal: Knowledge of General Education information will improve Description: Including pain rating scale, medication(s)/side effects and non-pharmacologic comfort measures Outcome: Progressing   Problem: Health Behavior/Discharge Planning: Goal: Ability to manage health-related needs will improve Outcome: Progressing   Problem: Clinical Measurements: Goal: Ability to maintain clinical measurements within normal limits will improve Outcome: Progressing Goal: Will remain free from infection Outcome: Progressing Goal: Diagnostic test results will improve Outcome: Progressing Goal: Respiratory complications will improve Outcome: Progressing Goal: Cardiovascular complication will be avoided Outcome: Progressing   Problem: Nutrition: Goal: Adequate nutrition will be maintained Outcome: Progressing   Problem: Coping: Goal: Level of anxiety will decrease Outcome: Progressing   Problem: Elimination: Goal: Will not experience complications related to bowel motility Outcome: Progressing Goal: Will not experience complications related to urinary retention Outcome: Progressing   Problem: Pain Managment: Goal: General experience of comfort will improve Outcome: Progressing   Problem: Safety: Goal: Ability to remain free from injury will improve Outcome: Progressing   Problem: Skin Integrity: Goal: Risk for impaired skin integrity will decrease Outcome: Progressing

## 2020-08-25 ENCOUNTER — Encounter: Payer: Self-pay | Admitting: Family Medicine

## 2020-08-25 ENCOUNTER — Other Ambulatory Visit: Payer: Self-pay

## 2020-08-25 NOTE — Progress Notes (Signed)
Daughter Opal Sidles given daily update

## 2020-08-25 NOTE — Progress Notes (Signed)
PROGRESS NOTE   HPI was taken from Jesus Chavez: Jesus Chavez is a 87 y.o. Caucasian   male with medical history significant for type 2 diabetes mellitus, essential hypertension, dyslipidemia, stage III chronic kidney disease, history of bladder and prostate cancer, and lumbar back pain, presented to the emergency room with acute onset of fall at [facility] where he went today at 2 PM from home.  The patient has been deteriorating progressively with inability to do his ADLs recently.  He was seen in the ER about 10 days ago and was found to have a small left lower lobe pneumonia for which she was given 1 week course of antibiotic therapy with Omnicef.  He has been fairly independent until 08/07/2020.  His daughter gives all the history.  When he fell today at his assisted living facility at around 8 PM, he was found on the floor as he was apparently trying to get to the bathroom.  His blood pressure was reportedly low and he was having chest pain.  During my interview he was very somnolent and would fall easily asleep when he is aroused.  No worsening cough or wheezing.  No reported fever or chills. ED Course: When he came to the ER, blood pressure was 83/53 with a heart rate of 112 and respiratory 19 temperature of 98.3 pulse oximetry was 92% on 2 L of O2 by nasal cannula.  BP later on was up to 94/61 with a heart rate of 110.  BP during my interview was up to systolic 774 on monitor.  Pulse oximetry was up to 95%.  Labs revealed a blood glucose of 258 and a BUN of 44 with creatinine 1.75.  CBC showed leukocytosis of 18.6.  Respiratory panel is currently pending.   Imaging: Two-view chest x-ray showed lower lung volumes with patchy left lower lobe atelectasis or infiltrate and small bilateral pleural effusions.  The patient has a DNR form as well as a MOST form indicating that he did not want any IV fluids.  He and his daughter also decided not to have IV antibiotic therapy and pursue comfort measures  during this admission.  He will be admitted to the palliative care bed for comfort care.   Pt is comfort care only. Pt has to quarantine for 10 days before going to a hospice facility   Jesus Chavez  JOI:[id number] DOB: 1932/02/26 DOA: 08/20/2020 PCP: Jesus Hess, MD    Assessment & Plan:   Active Problems:   Severe sepsis ([place])   COVID-19   Pneumonia due to infectious organism   Depression  Failure to thrive: secondary to all below. Continue w/ morphine, ativan prn   Sepsis: met criteria w/ tachycardia, tachypnea, hypotension & COVID19 pneumonia. Severe sepsis r/o. Pt refused IVFs & wants comfort care only   COVID19 pneumonia: continue w/ comfort care only  DM2: fairly well controlled w/ HbA1c 7.2 in 2021. Continue w/ comfort care only   HTN: continue w/ comfort care only   Depression: severity unknown. Continue w/ home dose of zoloft   Possible CKDIIIa: unknown baseline Cr/GFR. Continue w/ comfort care only   DVT prophylaxis: comfort care Code Status: DNR Family Communication: discussed pt's care w/ pt's daughter, Jesus Chavez, and answer her questions Disposition Plan: hospice facility   Level of care: Palliative Care  Status is: Inpatient  Remains inpatient appropriate because:Unsafe d/c plan  Dispo: The patient is from: Home              Anticipated  d/c is to:  hospice facility , waiting on a hospice bed              Patient currently is medically stable to d/c. to go to a hospice facility    Difficult to place patient: unclear        Consultants:  Palliative care  Procedures:   Antimicrobials:   Subjective: Pt is lethargic.  Objective: Vitals:   08/24/20 0445 08/24/20 0827 08/24/20 1211 08/25/20 0546  BP: (!) 133/91 (!) 153/100 128/84 (!) 136/124  Pulse: 79 86 89 78  Resp: 17 16 16 14   Temp:  98.3 F (36.8 C) 97.9 F (36.6 C)   TempSrc:      SpO2: 98% 100% 96% 100%  Weight:      Height:        Intake/Output Summary (Last 24 hours) at  08/25/2020 0733 Last data filed at 08/25/2020 0558 Gross per 24 hour  Intake 0 ml  Output 650 ml  Net -650 ml   Filed Weights   08/09/2020 2057  Weight: 82.1 kg    Examination:  General exam: Appears lethargic Respiratory system: decreased breath sounds b/l  Cardiovascular system: S1/S2+. No rubs or clicks  Gastrointestinal system: Abd is soft, NT, ND & hypoactive bowel sounds Central nervous system: Lethargic Psychiatry: judgement and insight appear abnormal. Flat mood and affect    Data Reviewed: I have personally reviewed following labs and imaging studies  CBC: Recent Labs  Lab 09/01/2020 2059 08/20/20 0831  WBC 18.6* 16.6*  HGB 13.8 14.0  HCT 41.2 41.6  MCV 91.6 90.6  PLT 224 201   Basic Metabolic Panel: Recent Labs  Lab 08/23/2020 2059 08/20/20 0831  NA 136 138  K 4.2 4.1  CL 105 110  CO2 21* 22  GLUCOSE 258* 97  BUN 44* 40*  CREATININE 1.75* 1.41*  CALCIUM 7.7* 7.7*   GFR: Estimated Creatinine Clearance: 36.4 mL/min (A) (by C-G formula based on SCr of 1.41 mg/dL (H)). Liver Function Tests: No results for input(s): AST, ALT, ALKPHOS, BILITOT, PROT, ALBUMIN in the last 168 hours. No results for input(s): LIPASE, AMYLASE in the last 168 hours. No results for input(s): AMMONIA in the last 168 hours. Coagulation Profile: No results for input(s): INR, PROTIME in the last 168 hours. Cardiac Enzymes: No results for input(s): CKTOTAL, CKMB, CKMBINDEX, TROPONINI in the last 168 hours. BNP (last 3 results) No results for input(s): PROBNP in the last 8760 hours. HbA1C: No results for input(s): HGBA1C in the last 72 hours. CBG: Recent Labs  Lab 08/20/20 0835 08/20/20 1159  GLUCAP 99 210*   Lipid Profile: No results for input(s): CHOL, HDL, LDLCALC, TRIG, CHOLHDL, LDLDIRECT in the last 72 hours. Thyroid Function Tests: No results for input(s): TSH, T4TOTAL, FREET4, T3FREE, THYROIDAB in the last 72 hours. Anemia Panel: No results for input(s): VITAMINB12,  FOLATE, FERRITIN, TIBC, IRON, RETICCTPCT in the last 72 hours. Sepsis Labs: No results for input(s): PROCALCITON, LATICACIDVEN in the last 168 hours.  Recent Results (from the past 240 hour(s))  Resp Panel by RT-PCR (Flu A&B, Covid) Nasopharyngeal Swab     Status: Abnormal   Collection Time: 08/23/2020 10:11 PM   Specimen: Nasopharyngeal Swab; Nasopharyngeal(NP) swabs in vial transport medium  Result Value Ref Range Status   SARS Coronavirus 2 by RT PCR POSITIVE (A) NEGATIVE Final    Comment: RESULT CALLED TO, READ BACK BY AND VERIFIED WITH: MARICAR KIMREY@2346  08/23/2020 RH (NOTE) SARS-CoV-2 target nucleic acids are DETECTED.  The SARS-CoV-2  RNA is generally detectable in upper respiratory specimens during the acute phase of infection. Positive results are indicative of the presence of the identified virus, but do not rule out bacterial infection or co-infection with other pathogens not detected by the test. Clinical correlation with patient history and other diagnostic information is necessary to determine patient infection status. The expected result is Negative.  Fact Sheet for Patients: EntrepreneurPulse.com.au  Fact Sheet for Healthcare Providers: IncredibleEmployment.be  This test is not yet approved or cleared by the [place] FDA and  has been authorized for detection and/or diagnosis of SARS-CoV-2 by FDA under an Emergency Use Authorization (EUA).  This EUA will remain in effect (meaning this test can be [place] ed) for the duration of  the COVID-19 declaration under Section 564(b)(1) of the Act, 21 U.S.C. section 360bbb-3(b)(1), unless the authorization is terminated or revoked sooner.     Influenza A by PCR NEGATIVE NEGATIVE Final   Influenza B by PCR NEGATIVE NEGATIVE Final    Comment: (NOTE) The Xpert Xpress SARS-CoV-2/FLU/RSV plus assay is intended as an aid in the diagnosis of influenza from Nasopharyngeal swab specimens  and should not be used as a sole basis for treatment. Nasal washings and aspirates are unacceptable for Xpert Xpress SARS-CoV-2/FLU/RSV testing.  Fact Sheet for Patients: EntrepreneurPulse.com.au  Fact Sheet for Healthcare Providers: IncredibleEmployment.be  This test is not yet approved or cleared by the [place] FDA and has been authorized for detection and/or diagnosis of SARS-CoV-2 by FDA under an Emergency Use Authorization (EUA). This EUA will remain in effect (meaning this test can be used) for the duration of the COVID-19 declaration under Section 564(b)(1) of the Act, 21 U.S.C. section 360bbb-3(b)(1), unless the authorization is terminated or revoked.  Performed at [facility], [address] [place], [place] [address]          Radiology Studies: No results found.      Scheduled Meds:  polyethylene glycol  17 g Oral Daily   sertraline  50 mg Oral Daily   Continuous Infusions:   LOS: 6 days    Time spent: 15 mins    Wyvonnia Dusky, MD [facility] Pager [phone]  If 7PM-7AM, please contact night-coverage 08/25/2020, 7:33 AM

## 2020-08-25 NOTE — Plan of Care (Signed)
Pt more delirious/restless overnight. PRN tylenol, ativan, and morphine given and pt able to rest for several hours. Foley in place for comfort. Fall/safety precautions maintained, rounding performed.   Problem: Education: Goal: Knowledge of General Education information will improve Description: Including pain rating scale, medication(s)/side effects and non-pharmacologic comfort measures Outcome: Progressing   Problem: Health Behavior/Discharge Planning: Goal: Ability to manage health-related needs will improve Outcome: Progressing   Problem: Clinical Measurements: Goal: Ability to maintain clinical measurements within normal limits will improve Outcome: Progressing Goal: Will remain free from infection Outcome: Progressing Goal: Diagnostic test results will improve Outcome: Progressing Goal: Respiratory complications will improve Outcome: Progressing Goal: Cardiovascular complication will be avoided Outcome: Progressing   Problem: Nutrition: Goal: Adequate nutrition will be maintained Outcome: Progressing   Problem: Coping: Goal: Level of anxiety will decrease Outcome: Progressing   Problem: Elimination: Goal: Will not experience complications related to bowel motility Outcome: Progressing Goal: Will not experience complications related to urinary retention Outcome: Progressing   Problem: Pain Managment: Goal: General experience of comfort will improve Outcome: Progressing

## 2020-08-26 DIAGNOSIS — N1831 Chronic kidney disease, stage 3a: Secondary | ICD-10-CM

## 2020-08-26 DIAGNOSIS — R319 Hematuria, unspecified: Secondary | ICD-10-CM

## 2020-08-26 DIAGNOSIS — I10 Essential (primary) hypertension: Secondary | ICD-10-CM

## 2020-08-26 DIAGNOSIS — Z515 Encounter for palliative care: Secondary | ICD-10-CM

## 2020-08-26 DIAGNOSIS — R627 Adult failure to thrive: Secondary | ICD-10-CM

## 2020-08-26 DIAGNOSIS — J1282 Pneumonia due to coronavirus disease 2019: Secondary | ICD-10-CM

## 2020-08-26 MED ORDER — MORPHINE SULFATE (PF) 2 MG/ML IV SOLN: 2.0000 mg | INTRAVENOUS | Status: DC

## 2020-08-26 MED ORDER — TAMSULOSIN HCL 0.4 MG PO CAPS: 0.4000 mg | ORAL_CAPSULE | Freq: Every day | ORAL | Status: DC

## 2020-08-26 MED ORDER — MORPHINE 100MG IN NS 100ML (1MG/ML) PREMIX INFUSION
3.0000 mg/h | INTRAVENOUS | Status: DC
  Administered 2020-08-26: 3 mg/h via INTRAVENOUS
  Filled 2020-08-26 (×2): qty 100

## 2020-08-26 NOTE — Progress Notes (Signed)
Patient ID: Jesus Chavez, male   DOB: 12-19-1931, 87 y.o.   MRN: [id number] [facility] PROGRESS NOTE  Moustapha Tooker PZW:[id number] DOB: [birth year] DOA: 08/22/2020 PCP: Glean Hess, MD  HPI/Subjective: Patient was agitated last night and pulled out the Foley catheter.  Some blood seen in the diaper.  Patient was agitated this morning when I saw him.  Unable to answer any questions.  Initially admitted with sepsis and COVID-19 pneumonia  Objective: Vitals:   08/25/20 0546 08/26/20 0258  BP: (!) 136/124 (!) 93/53  Pulse: 78 95  Resp: 14 18  Temp:  98.2 F (36.8 C)  SpO2: 100% 91%    Intake/Output Summary (Last 24 hours) at 08/26/2020 4235 Last data filed at 08/26/2020 0300 Gross per 24 hour  Intake --  Output 250 ml  Net -250 ml   Filed Weights   08/20/2020 2057  Weight: 82.1 kg    ROS: Review of Systems  Unable to perform ROS: Acuity of condition  Exam: Physical Exam HENT:     Head: Normocephalic.     Mouth/Throat:     Pharynx: No oropharyngeal exudate.  Eyes:     General: Lids are normal.     Conjunctiva/sclera: Conjunctivae normal.  Cardiovascular:     Rate and Rhythm: Normal rate and regular rhythm.     Heart sounds: Normal heart sounds, S1 normal and S2 normal.  Pulmonary:     Breath sounds: Normal breath sounds. No decreased breath sounds, wheezing, rhonchi or rales.  Abdominal:     Palpations: Abdomen is soft.     Tenderness: There is no abdominal tenderness.  Musculoskeletal:     Right lower leg: No swelling.     Left lower leg: No swelling.  Skin:    General: Skin is warm.     Findings: No rash.  Neurological:     Mental Status: He is lethargic.     Data Reviewed: Basic Metabolic Panel: Recent Labs  Lab 08/12/2020 2059 08/20/20 0831  NA 136 138  K 4.2 4.1  CL 105 110  CO2 21* 22  GLUCOSE 258* 97  BUN 44* 40*  CREATININE 1.75* 1.41*  CALCIUM 7.7* 7.7*    CBC: Recent Labs  Lab 08/18/2020 2059 08/20/20 0831  WBC 18.6* 16.6*   HGB 13.8 14.0  HCT 41.2 41.6  MCV 91.6 90.6  PLT 224 219     CBG: Recent Labs  Lab 08/20/20 0835 08/20/20 1159  GLUCAP 99 210*    Recent Results (from the past 240 hour(s))  Resp Panel by RT-PCR (Flu A&B, Covid) Nasopharyngeal Swab     Status: Abnormal   Collection Time: 08/29/2020 10:11 PM   Specimen: Nasopharyngeal Swab; Nasopharyngeal(NP) swabs in vial transport medium  Result Value Ref Range Status   SARS Coronavirus 2 by RT PCR POSITIVE (A) NEGATIVE Final    Comment: RESULT CALLED TO, READ BACK BY AND VERIFIED WITH: MARICAR KIMREY@2346  3/61/44 RH (NOTE) SARS-CoV-2 target nucleic acids are DETECTED.  The SARS-CoV-2 RNA is generally detectable in upper respiratory specimens during the acute phase of infection. Positive results are indicative of the presence of the identified virus, but do not rule out bacterial infection or co-infection with other pathogens not detected by the test. Clinical correlation with patient history and other diagnostic information is necessary to determine patient infection status. The expected result is Negative.  Fact Sheet for Patients: EntrepreneurPulse.com.au  Fact Sheet for Healthcare Providers: IncredibleEmployment.be  This test is not yet approved or cleared by the [place]  States FDA and  has been authorized for detection and/or diagnosis of SARS-CoV-2 by FDA under an Emergency Use Authorization (EUA).  This EUA will remain in effect (meaning this test can be [place] ed) for the duration of  the COVID-19 declaration under Section 564(b)(1) of the Act, 21 U.S.C. section 360bbb-3(b)(1), unless the authorization is terminated or revoked sooner.     Influenza A by PCR NEGATIVE NEGATIVE Final   Influenza B by PCR NEGATIVE NEGATIVE Final    Comment: (NOTE) The Xpert Xpress SARS-CoV-2/FLU/RSV plus assay is intended as an aid in the diagnosis of influenza from Nasopharyngeal swab specimens and should not  be used as a sole basis for treatment. Nasal washings and aspirates are unacceptable for Xpert Xpress SARS-CoV-2/FLU/RSV testing.  Fact Sheet for Patients: EntrepreneurPulse.com.au  Fact Sheet for Healthcare Providers: IncredibleEmployment.be  This test is not yet approved or cleared by the [place] FDA and has been authorized for detection and/or diagnosis of SARS-CoV-2 by FDA under an Emergency Use Authorization (EUA). This EUA will remain in effect (meaning this test can be used) for the duration of the COVID-19 declaration under Section 564(b)(1) of the Act, 21 U.S.C. section 360bbb-3(b)(1), unless the authorization is terminated or revoked.  Performed at [facility], [address] [place], [place] [address]       Scheduled Meds:  polyethylene glycol  17 g Oral Daily   sertraline  50 mg Oral Daily    Assessment/Plan:  End-of-life care.  Patient with agitation this morning.  Pulled out Foley last night.  Case discussed with daughter on the phone and she is interested in having pain medication standing dose.  I offered starting a morphine drip and she was agreeable. Sepsis, present on admission and COVID-19 pneumonia.  Comfort care only Failure to thrive Type 2 diabetes mellitus Essential hypertension Depression on Zoloft Chronic kidney disease stage IIIa Trauma with Foley removal and hematuria.  Try not to place Foley catheter if we do not have to.        Code Status:     Code Status Orders  (From admission, onward)           Start     Ordered   08/24/2020 2205  Do not attempt resuscitation (DNR)  Continuous       Question Answer Comment  In the event of cardiac or respiratory ARREST Do not call a "code blue"   In the event of cardiac or respiratory ARREST Do not perform Intubation, CPR, defibrillation or ACLS   In the event of cardiac or respiratory ARREST Use medication by any route, position, wound  care, and other measures to relive pain and suffering. May use oxygen, suction and manual treatment of airway obstruction as needed for comfort.      09/01/2020 2209           Code Status History     This patient has a current code status but no historical code status.      Advance Directive Documentation    Flowsheet Row Most Recent Value  Type of Advance Directive Out of facility DNR (pink MOST or yellow form)  Pre-existing out of facility DNR order (yellow form or pink MOST form) Pink Most/Yellow Form available - Physician notified to receive inpatient order  "MOST" Form in Place? --      Family Communication: Spoke with daughter on the phone Disposition Plan: Status is: Inpatient  Dispo: The patient is from: Home  Anticipated d/c is to: Potentially hospice home              Patient currently receiving end-of-life care here in the hospital   Difficult to place patient.  No.  Time spent: 28 minutes  [place]

## 2020-08-27 DIAGNOSIS — R310 Gross hematuria: Secondary | ICD-10-CM

## 2020-08-27 MED ORDER — GLYCOPYRROLATE 0.2 MG/ML IJ SOLN: 0.4000 mg | Freq: Three times a day (TID) | INTRAMUSCULAR | Status: DC

## 2020-09-04 NOTE — TOC Progression Note (Signed)
Transition of Care [facility]) - Progression Note    Patient Details  Name: Jesus Chavez MRN: [id number] Date of Birth: 03/04/1932  [facility]) CM/SW [place], RN Phone Number: 08/30/2020, 9:44 AM  Clinical Narrative:   Patient is on day 8 of COVID isolation today.  [facility] will reassess for Hospice House when COVID precautions end.  Plan is for patient to continue comfort care and be moved  to [facility].         Expected Discharge Plan and Services                                                 Social Determinants of Health (SDOH) Interventions    Readmission Risk Interventions No flowsheet data found.

## 2020-09-04 NOTE — Progress Notes (Signed)
Patient ID: Jesus Chavez, male   DOB: 10-Sep-1931, 87 y.o.   MRN: [id number] [facility] PROGRESS NOTE  Jesus Chavez OVF:[id number] DOB: 08/09/1931 DOA: 08/25/2020 PCP: Glean Hess, MD  HPI/Subjective: Patient seen this morning and was unresponsive to sternal rub.  He was gurgling on secretions.  Was comfortable on morphine drip.  I notified the patient's daughter that the patient was close.  Objective: Vitals:   08/26/20 1623 08/26/20 2100  BP: 105/63 (!) 101/58  Pulse: 92 90  Resp: 16 16  Temp: 98.4 F (36.9 C) (!) 97 F (36.1 C)  SpO2:  100%    Intake/Output Summary (Last 24 hours) at 2020/09/25 0859 Last data filed at 09/25/20 0300 Gross per 24 hour  Intake 34.88 ml  Output --  Net 34.88 ml   Filed Weights   08/18/2020 2057  Weight: 82.1 kg    ROS: Review of Systems  Unable to perform ROS: Acuity of condition  Exam: Physical Exam HENT:     Head: Normocephalic.     Mouth/Throat:     Pharynx: No oropharyngeal exudate.  Eyes:     General: Lids are normal.     Conjunctiva/sclera: Conjunctivae normal.  Cardiovascular:     Rate and Rhythm: Normal rate and regular rhythm.     Heart sounds: Normal heart sounds, S1 normal and S2 normal.  Pulmonary:     Breath sounds: Decreased air movement and transmitted upper airway sounds present. Examination of the right-middle field reveals rhonchi. Examination of the left-middle field reveals rhonchi. Examination of the right-lower field reveals decreased breath sounds and rhonchi. Examination of the left-lower field reveals decreased breath sounds and rhonchi. Decreased breath sounds and rhonchi present. No wheezing.     Comments: Upper airway congestion. Abdominal:     Palpations: Abdomen is soft.     Tenderness: There is no abdominal tenderness.  Musculoskeletal:     Right ankle: No swelling.     Left ankle: No swelling.  Neurological:     Comments: Unresponsive to sternal rub       CBG: Recent Labs  Lab  08/20/20 1159  GLUCAP 210*    Recent Results (from the past 240 hour(s))  Resp Panel by RT-PCR (Flu A&B, Covid) Nasopharyngeal Swab     Status: Abnormal   Collection Time: 08/21/2020 10:11 PM   Specimen: Nasopharyngeal Swab; Nasopharyngeal(NP) swabs in vial transport medium  Result Value Ref Range Status   SARS Coronavirus 2 by RT PCR POSITIVE (A) NEGATIVE Final    Comment: RESULT CALLED TO, READ BACK BY AND VERIFIED WITH: MARICAR KIMREY@2346  08/11/2020 RH (NOTE) SARS-CoV-2 target nucleic acids are DETECTED.  The SARS-CoV-2 RNA is generally detectable in upper respiratory specimens during the acute phase of infection. Positive results are indicative of the presence of the identified virus, but do not rule out bacterial infection or co-infection with other pathogens not detected by the test. Clinical correlation with patient history and other diagnostic information is necessary to determine patient infection status. The expected result is Negative.  Fact Sheet for Patients: EntrepreneurPulse.com.au  Fact Sheet for Healthcare Providers: IncredibleEmployment.be  This test is not yet approved or cleared by the [place] FDA and  has been authorized for detection and/or diagnosis of SARS-CoV-2 by FDA under an Emergency Use Authorization (EUA).  This EUA will remain in effect (meaning this test can be [place] ed) for the duration of  the COVID-19 declaration under Section 564(b)(1) of the Act, 21 U.S.C. section 360bbb-3(b)(1), unless the authorization is  terminated or revoked sooner.     Influenza A by PCR NEGATIVE NEGATIVE Final   Influenza B by PCR NEGATIVE NEGATIVE Final    Comment: (NOTE) The Xpert Xpress SARS-CoV-2/FLU/RSV plus assay is intended as an aid in the diagnosis of influenza from Nasopharyngeal swab specimens and should not be used as a sole basis for treatment. Nasal washings and aspirates are unacceptable for Xpert Xpress  SARS-CoV-2/FLU/RSV testing.  Fact Sheet for Patients: EntrepreneurPulse.com.au  Fact Sheet for Healthcare Providers: IncredibleEmployment.be  This test is not yet approved or cleared by the [place] FDA and has been authorized for detection and/or diagnosis of SARS-CoV-2 by FDA under an Emergency Use Authorization (EUA). This EUA will remain in effect (meaning this test can be used) for the duration of the COVID-19 declaration under Section 564(b)(1) of the Act, 21 U.S.C. section 360bbb-3(b)(1), unless the authorization is terminated or revoked.  Performed at [facility], [address] [place], [place] [address]      Studies: No results found.  Scheduled Meds:  polyethylene glycol  17 g Oral Daily   sertraline  50 mg Oral Daily   Continuous Infusions:  morphine 3 mg/hr (08/26/20 1521)    Assessment/Plan:  End-of-life care.  Patient was started on morphine drip yesterday afternoon secondary to agitation.  This morning unresponsive to sternal rub and gurgling on some upper airway secretions.  Glycopyrrolate added. Sepsis, present on admission COVID-19 pneumonia.  Comfort care only Failure to thrive Type 2 diabetes mellitus Essential hypertension Depression Chronic kidney disease stage IIIa Hematuria and trauma with Foley removal  Patient passed away at 10:50 AM.  Patient's daughter was present.    Code Status:     Code Status Orders  (From admission, onward)           Start     Ordered   08/07/2020 2205  Do not attempt resuscitation (DNR)  Continuous       Question Answer Comment  In the event of cardiac or respiratory ARREST Do not call a "code blue"   In the event of cardiac or respiratory ARREST Do not perform Intubation, CPR, defibrillation or ACLS   In the event of cardiac or respiratory ARREST Use medication by any route, position, wound care, and other measures to relive pain and suffering. May use  oxygen, suction and manual treatment of airway obstruction as needed for comfort.      08/13/2020 2209           Code Status History     This patient has a current code status but no historical code status.      Advance Directive Documentation    Flowsheet Row Most Recent Value  Type of Advance Directive Out of facility DNR (pink MOST or yellow form)  Pre-existing out of facility DNR order (yellow form or pink MOST form) Pink Most/Yellow Form available - Physician notified to receive inpatient order  "MOST" Form in Place? --      Family Communication: I spoke with the patient's daughter this morning to give an update.  I spoke with the patient's daughter again after he passed away. Disposition Plan: Status is: Inpatient  Time spent: 24 minutes  [place]

## 2020-09-04 NOTE — Death Summary Note (Signed)
DEATH SUMMARY   Patient Details  Name: Jesus Chavez MRN: [id number] DOB: 1931/11/30  Admission/Discharge Information   Admit Date:  09/06/20  Date of Death: Date of Death: Sep 14, 2020  Time of Death: Time of Death: 21  Length of Stay: 8  Referring Physician: Glean Hess, MD   Reason(s) for Hospitalization  Came in with chest pain and was diagnosed with sepsis with pneumonia and COVID-19 pneumonia.  Diagnoses  Preliminary cause of death:  Secondary Diagnoses (including complications and co-morbidities):  Active Problems:   Type 2 diabetes mellitus without complication, without long-term current use of insulin (HCC)   Sepsis ([place])   Pneumonia due to COVID-19 virus   Pneumonia due to infectious organism   Depression   End of life care   Failure to thrive in adult   Stage 3a chronic kidney disease [facility])   Hematuria   Brief Hospital Course (including significant findings, care, treatment, and services provided and events leading to death)  Jesus Chavez is a 87 y.o. year old male who was admitted with sepsis and found to be COVID-19 positive with pneumonia.  Patient was seen by palliative care and made comfort care measures.  The patient pulled out his Foley catheter and was agitated when I saw him on 08/26/2020.  In discussing with patient's daughter, we decided to start morphine drip to make him more comfortable.  The patient was seen on 09/15/2018 2 in the morning and was unresponsive to sternal rub and had some gurgling on some upper airway secretions.  I notified the patient's daughter and she was able to come in and visit and the patient passed away on 2020-09-14 at 10:50 AM.  Other medical problems: Failure to thrive, type 2 diabetes mellitus, essential hypertension, depression, chronic kidney disease stage IIIa.    Pertinent Labs and Studies  Significant Diagnostic Studies DG Chest 2 View  Result Date: 09-06-2020 CLINICAL DATA:  Chest pain after falling. EXAM: CHEST  - 2 VIEW COMPARISON:  Radiograph 08/07/2020 FINDINGS: The patient is rotated on the lateral view. The heart size and mediastinal contours are stable with aortic atherosclerosis. There are lower lung volumes with vascular congestion and new patchy airspace disease at the left lung base. There are probable small bilateral pleural effusions, best seen on the lateral view. No evidence of pneumothorax or acute fracture. IMPRESSION: Lower lung volumes with patchy left lower lobe atelectasis or infiltrate. Small bilateral pleural effusions. Electronically Signed   By: Richardean Sale M.D.   On: 2020/09/06 21:21   DG Chest 2 View  Result Date: 08/07/2020 CLINICAL DATA:  Shortness of breath and tachycardia EXAM: CHEST - 2 VIEW COMPARISON:  None. FINDINGS: There is ill-defined opacity in the medial left base. Lungs elsewhere clear. Heart size and pulmonary vascularity are normal. No adenopathy. There is aortic atherosclerosis. There is degenerative change in the thoracic spine. IMPRESSION: Ill-defined opacity medial left base, likely representing a developing pneumonia. Lungs elsewhere clear. Cardiac silhouette. Aortic Atherosclerosis (ICD10-I70.0). Electronically Signed   By: Lowella Grip III M.D.   On: 08/07/2020 20:23    Microbiology Recent Results (from the past 240 hour(s))  Resp Panel by RT-PCR (Flu A&B, Covid) Nasopharyngeal Swab     Status: Abnormal   Collection Time: September 06, 2020 10:11 PM   Specimen: Nasopharyngeal Swab; Nasopharyngeal(NP) swabs in vial transport medium  Result Value Ref Range Status   SARS Coronavirus 2 by RT PCR POSITIVE (A) NEGATIVE Final    Comment: RESULT CALLED TO, READ BACK BY AND VERIFIED WITH:  MARICAR [email or web address]@[email or web address]  08/09/2020 [facility] (NOTE) SARS-CoV-2 target nucleic acids are DETECTED.  The SARS-CoV-2 RNA is generally detectable in upper respiratory specimens during the acute phase of infection. Positive results are indicative of the presence of the identified virus, but do not  rule out bacterial infection or co-infection with other pathogens not detected by the test. Clinical correlation with patient history and other diagnostic information is necessary to determine patient infection status. The expected result is Negative.  Fact Sheet for Patients: EntrepreneurPulse.com.au  Fact Sheet for Healthcare Providers: IncredibleEmployment.be  This test is not yet approved or cleared by the [place] FDA and  has been authorized for detection and/or diagnosis of SARS-CoV-2 by FDA under an Emergency Use Authorization (EUA).  This EUA will remain in effect (meaning this test can be [place] ed) for the duration of  the COVID-19 declaration under Section 564(b)(1) of the Act, 21 U.S.C. section 360bbb-3(b)(1), unless the authorization is terminated or revoked sooner.     Influenza A by PCR NEGATIVE NEGATIVE Final   Influenza B by PCR NEGATIVE NEGATIVE Final    Comment: (NOTE) The Xpert Xpress SARS-CoV-2/FLU/RSV plus assay is intended as an aid in the diagnosis of influenza from Nasopharyngeal swab specimens and should not be used as a sole basis for treatment. Nasal washings and aspirates are unacceptable for Xpert Xpress SARS-CoV-2/FLU/RSV testing.  Fact Sheet for Patients: EntrepreneurPulse.com.au  Fact Sheet for Healthcare Providers: IncredibleEmployment.be  This test is not yet approved or cleared by the [place] FDA and has been authorized for detection and/or diagnosis of SARS-CoV-2 by FDA under an Emergency Use Authorization (EUA). This EUA will remain in effect (meaning this test can be used) for the duration of the COVID-19 declaration under Section 564(b)(1) of the Act, 21 U.S.C. section 360bbb-3(b)(1), unless the authorization is terminated or revoked.  Performed at [facility], [address] [place], [place] [address]      Loletha Grayer 09-21-2020,  12:09 PM

## 2020-09-04 DEATH — deceased

## 2023-02-19 IMAGING — CR DG CHEST 2V
1 series · 2 of 2 positions shown · non-contrast
Comparison: None.

CLINICAL DATA: Shortness of breath and tachycardia

EXAM:
CHEST - 2 VIEW

[Series 1: dg chest 2 view · 0.14mm/px · 2 of 2 slices shown]
[im 1/2]
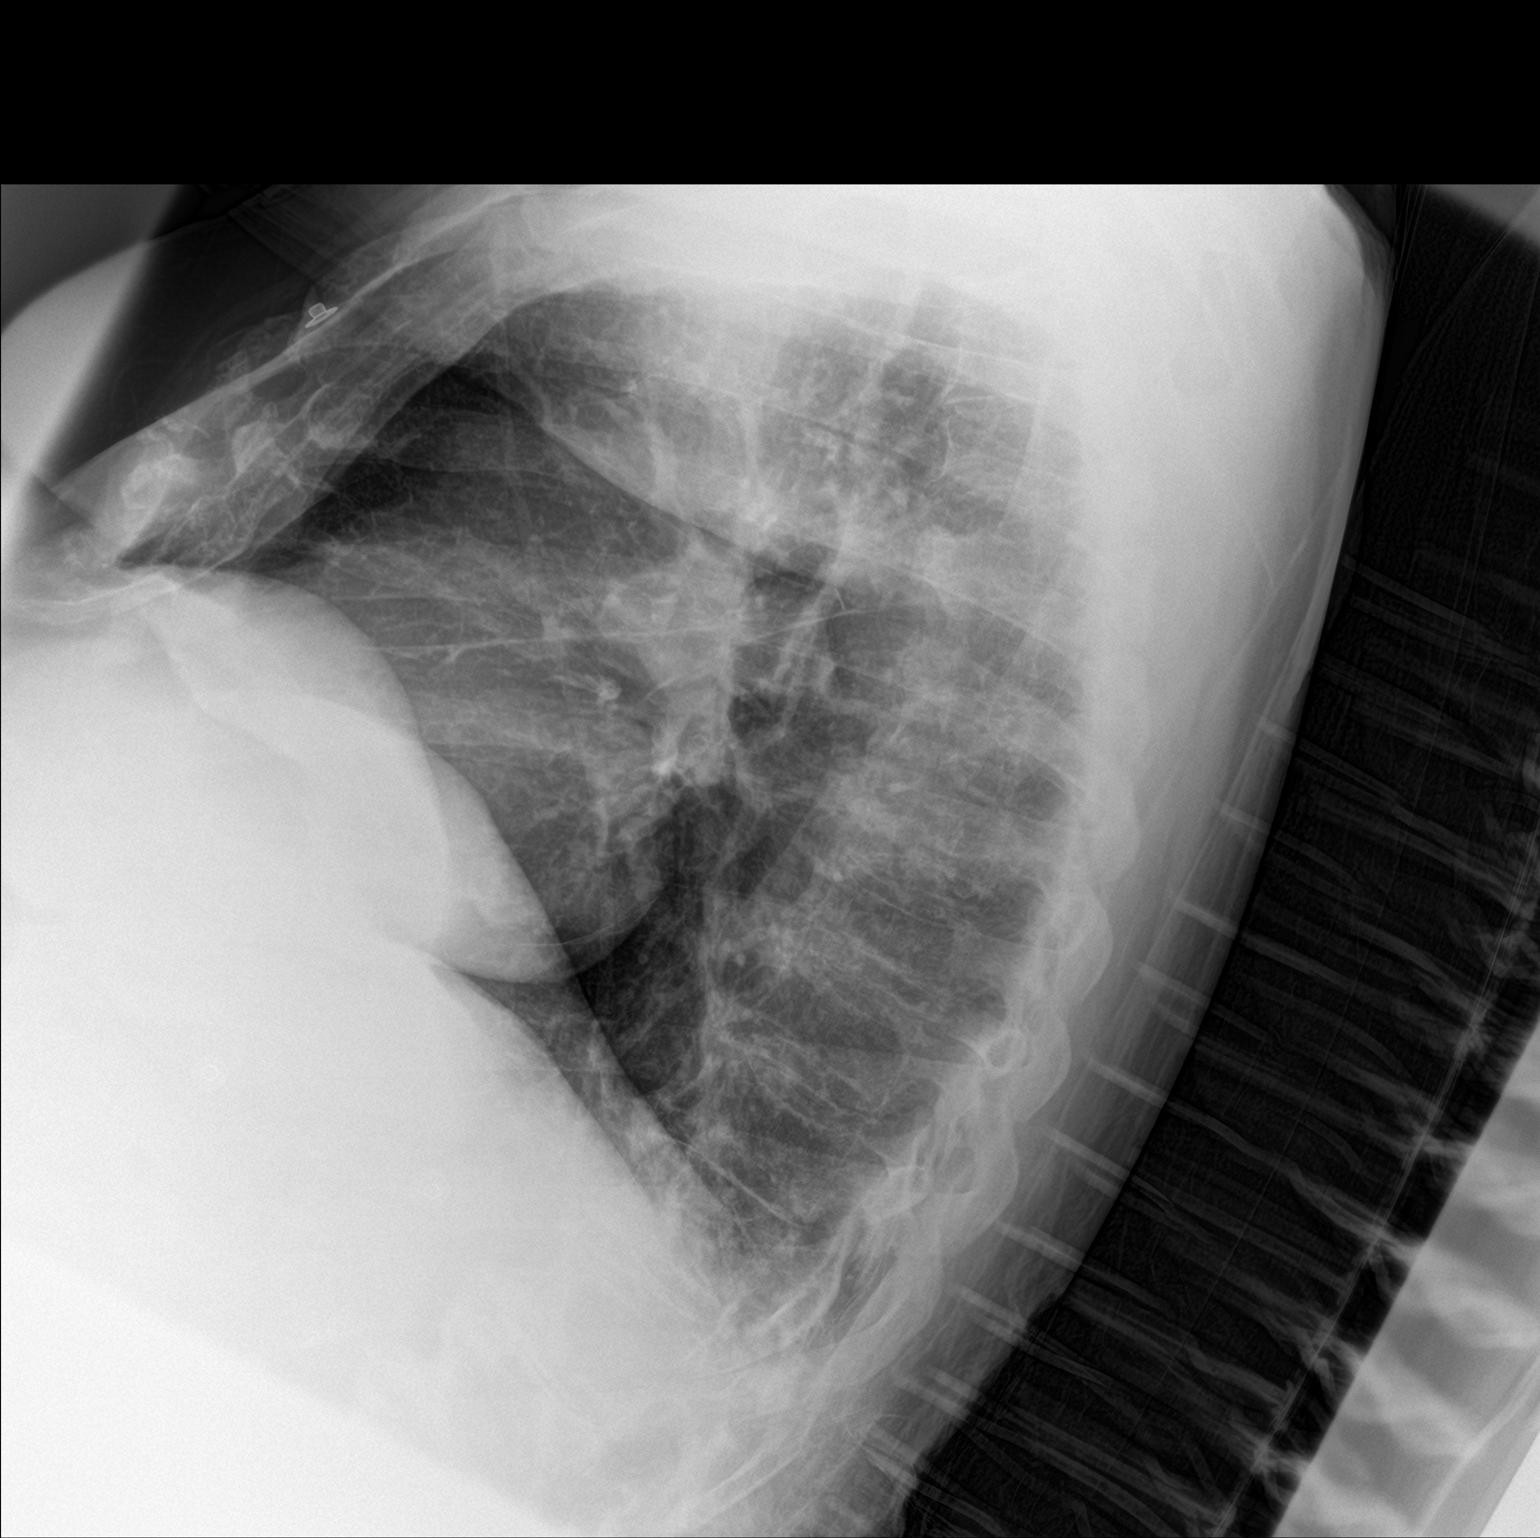
[im 2/2]
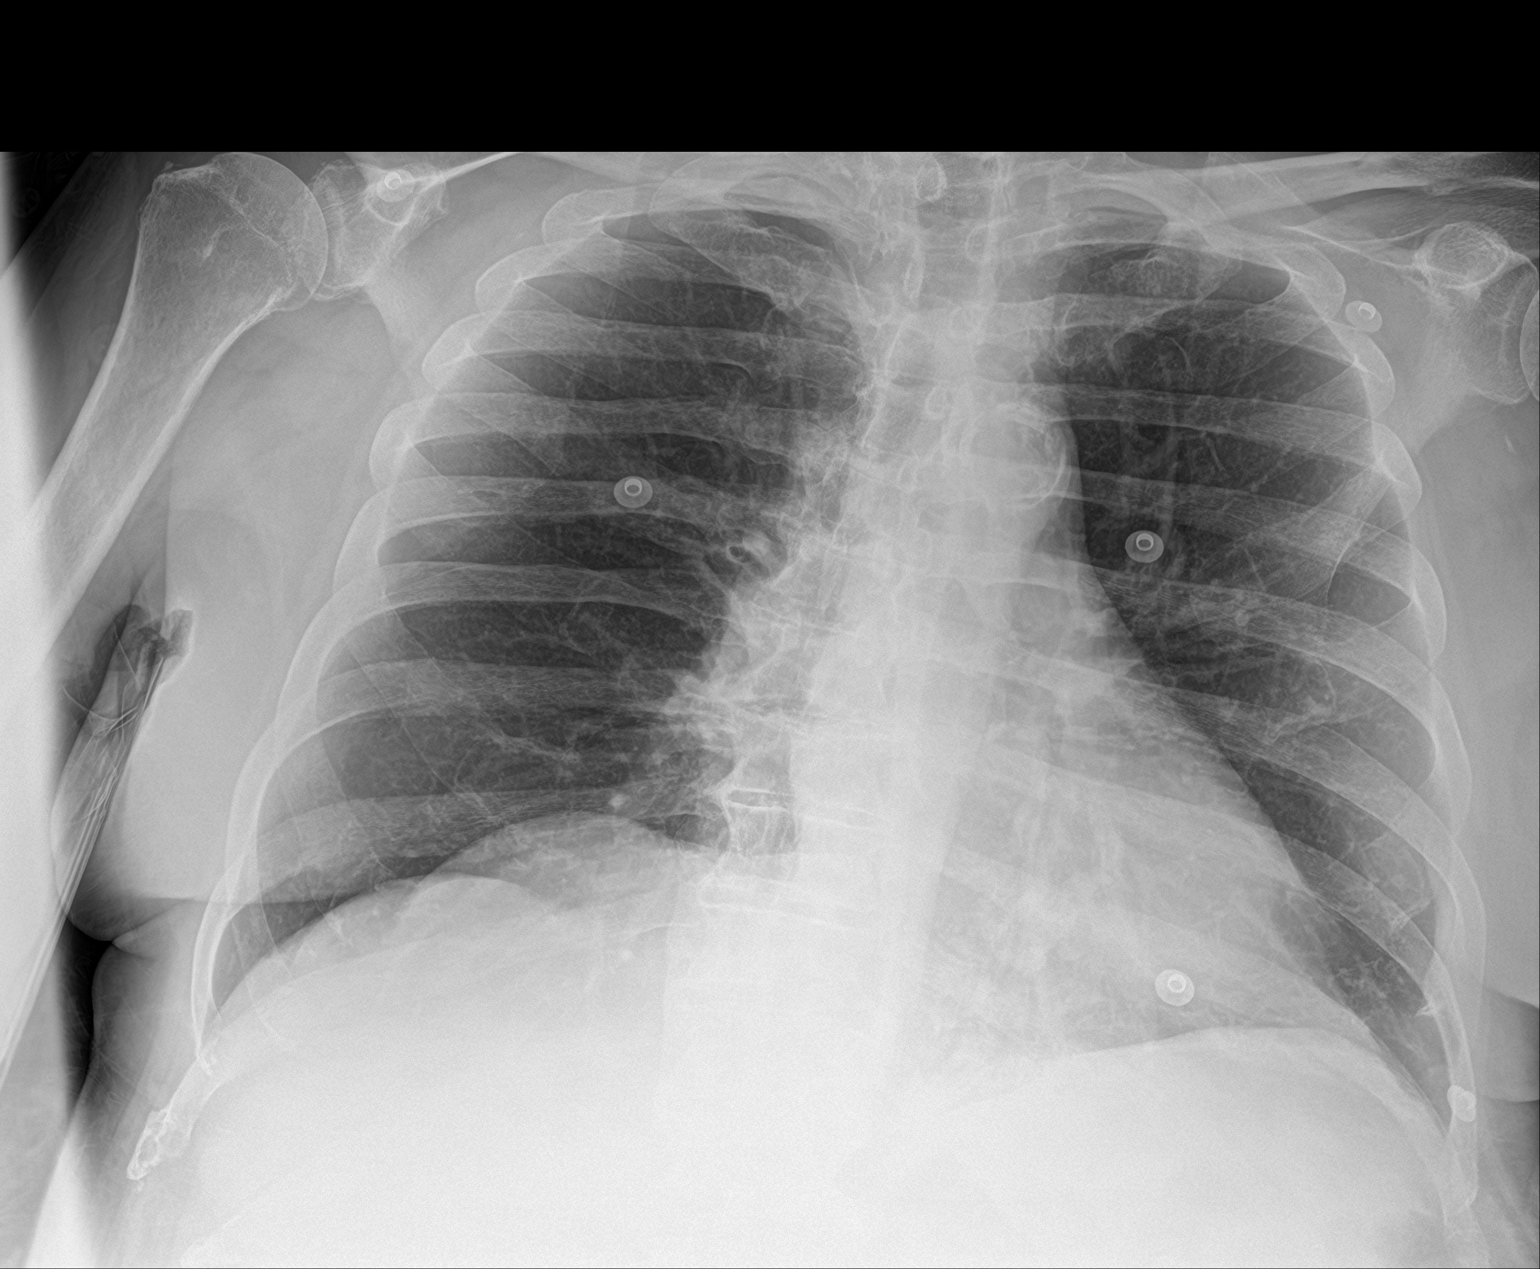

[2 of 2 positions shown; findings below may reference images not displayed]

FINDINGS: There is ill-defined opacity in the medial left base. Lungs
elsewhere clear. Heart size and pulmonary vascularity are normal. No
adenopathy. There is aortic atherosclerosis. There is degenerative
change in the thoracic spine.
IMPRESSION: Ill-defined opacity medial left base, likely representing a
developing pneumonia. Lungs elsewhere clear. Cardiac silhouette.
Aortic Atherosclerosis ([id number]).

## 2023-03-03 IMAGING — CR DG CHEST 2V
2 series · 2 of 2 positions shown · non-contrast
Comparison: Radiograph 08/07/2020

CLINICAL DATA: Chest pain after falling.

EXAM:
CHEST - 2 VIEW

[chest lat]
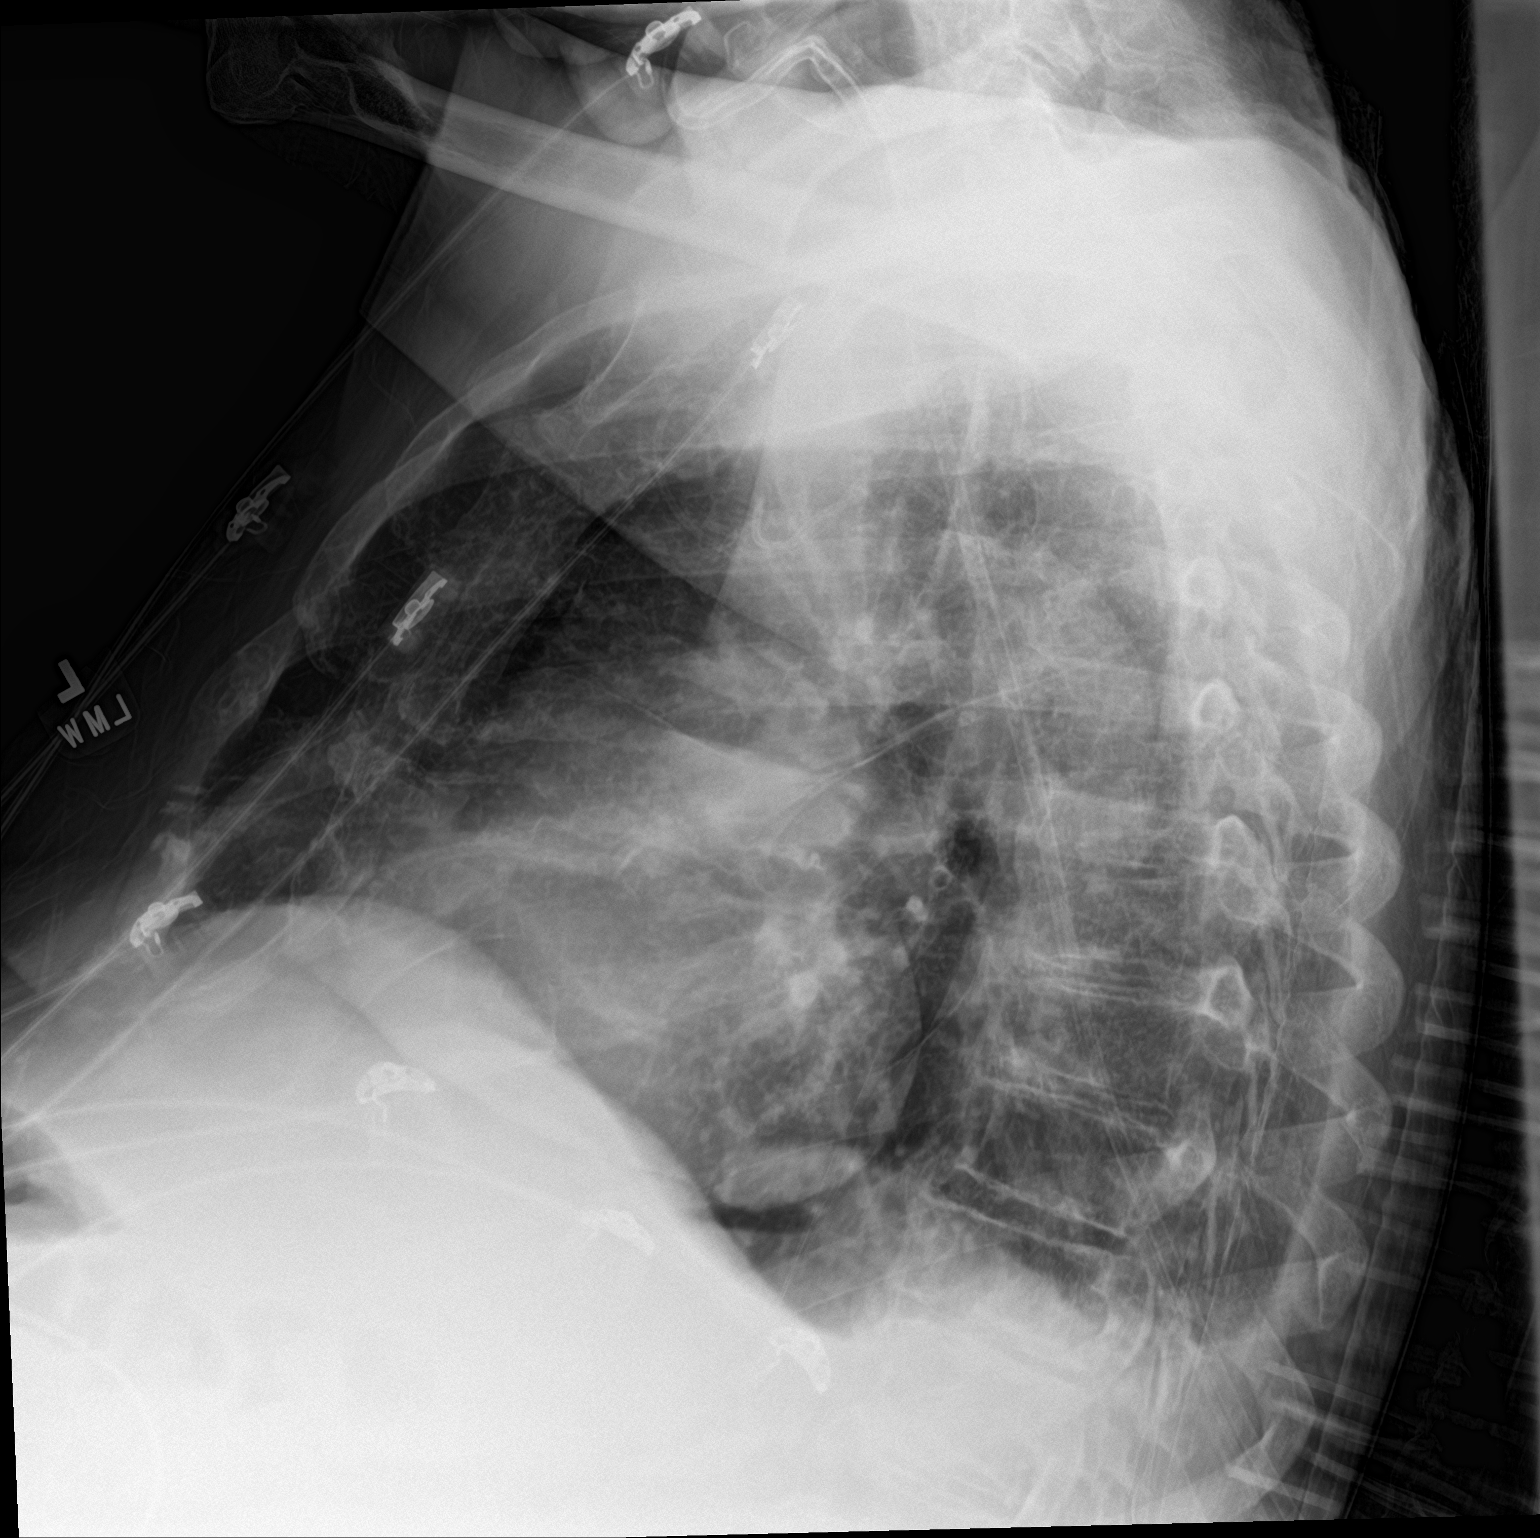

[chest ap]
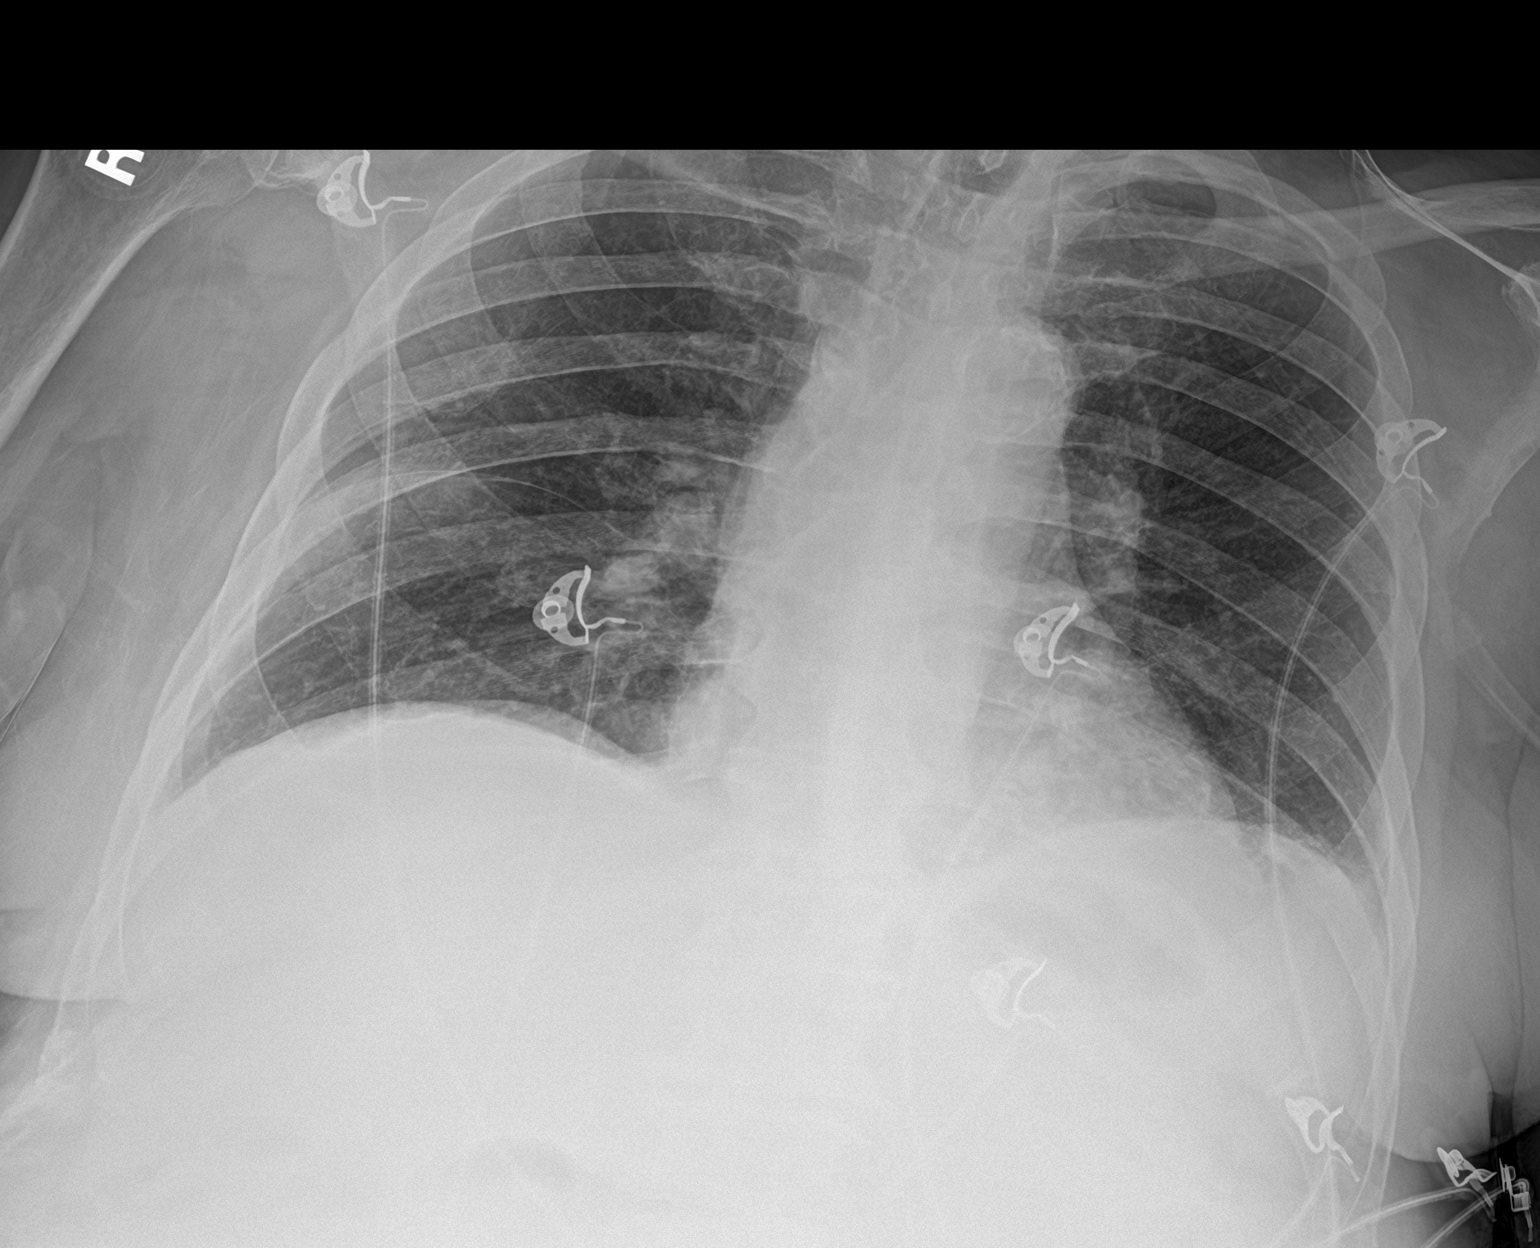

[2 of 2 positions shown; findings below may reference images not displayed]

FINDINGS: The patient is rotated on the lateral view. The heart size and
mediastinal contours are stable with aortic atherosclerosis. There
are lower lung volumes with vascular congestion and new patchy
airspace disease at the left lung base. There are probable small
bilateral pleural effusions, best seen on the lateral view. No
evidence of pneumothorax or acute fracture.
IMPRESSION: Lower lung volumes with patchy left lower lobe atelectasis or
infiltrate. Small bilateral pleural effusions.
